# Patient Record
Sex: Male | Born: 1985 | Race: White | Hispanic: No | Smoking: Former smoker
Health system: Southern US, Community
[De-identification: ages and names within clinical notes are randomized; demographics above are authoritative.]

## PROBLEM LIST (undated history)

## (undated) DIAGNOSIS — F32A Depression, unspecified: Secondary | ICD-10-CM

## (undated) DIAGNOSIS — F419 Anxiety disorder, unspecified: Secondary | ICD-10-CM

## (undated) HISTORY — PX: TYMPANOSTOMY TUBE PLACEMENT: SHX32

## (undated) HISTORY — DX: Depression, unspecified: F32.A

## (undated) HISTORY — PX: WISDOM TOOTH EXTRACTION: SHX21

## (undated) HISTORY — DX: Anxiety disorder, unspecified: F41.9

---

## 2021-08-09 ENCOUNTER — Ambulatory Visit: Payer: BC Managed Care – PPO | Admitting: Family Medicine

## 2021-08-09 ENCOUNTER — Encounter: Payer: Self-pay | Admitting: Family Medicine

## 2021-08-09 VITALS — BP 128/81 | HR 90 | Temp 97.2°F | Resp 20 | Ht 70.0 in | Wt 230.0 lb

## 2021-08-09 DIAGNOSIS — F1990 Other psychoactive substance use, unspecified, uncomplicated: Secondary | ICD-10-CM

## 2021-08-09 DIAGNOSIS — E669 Obesity, unspecified: Secondary | ICD-10-CM

## 2021-08-09 DIAGNOSIS — R5383 Other fatigue: Secondary | ICD-10-CM | POA: Diagnosis not present

## 2021-08-09 DIAGNOSIS — F419 Anxiety disorder, unspecified: Secondary | ICD-10-CM | POA: Diagnosis not present

## 2021-08-09 DIAGNOSIS — F32A Depression, unspecified: Secondary | ICD-10-CM

## 2021-08-09 DIAGNOSIS — M1990 Unspecified osteoarthritis, unspecified site: Secondary | ICD-10-CM

## 2021-08-09 DIAGNOSIS — R6889 Other general symptoms and signs: Secondary | ICD-10-CM | POA: Diagnosis not present

## 2021-08-09 DIAGNOSIS — Z7689 Persons encountering health services in other specified circumstances: Secondary | ICD-10-CM

## 2021-08-09 DIAGNOSIS — E66811 Obesity, class 1: Secondary | ICD-10-CM

## 2021-08-09 DIAGNOSIS — Z136 Encounter for screening for cardiovascular disorders: Secondary | ICD-10-CM | POA: Diagnosis not present

## 2021-08-09 DIAGNOSIS — J342 Deviated nasal septum: Secondary | ICD-10-CM

## 2021-08-09 MED ORDER — DICLOFENAC SODIUM 1 % EX GEL: 2.0000 g | Freq: Four times a day (QID) | CUTANEOUS | 0 refills | Status: DC | PRN

## 2021-08-09 MED ORDER — DICLOFENAC SODIUM 75 MG PO TBEC: 75.0000 mg | DELAYED_RELEASE_TABLET | Freq: Two times a day (BID) | ORAL | 1 refills | Status: DC

## 2021-08-09 NOTE — Progress Notes (Signed)
? ?Assessment & Plan:  ?1. Depressive disorder in remission ?Well controlled on current regimen.  ?- FLUoxetine (PROZAC) 20 MG/5ML solution; Take 1 mg by mouth every morning. ?- CBC with Differential/Platelet ?- CMP14+EGFR ? ?2. Anxiety ?Well controlled on current regimen.  ?- FLUoxetine (PROZAC) 20 MG/5ML solution; Take 1 mg by mouth every morning. ? ?3. Arthritis ?Uncontrolled. Started diclofenac tablets and gel. Advised to avoid all other NSAIDs.  Education provided on arthritis. ?- CMP14+EGFR ?- diclofenac (VOLTAREN) 75 MG EC tablet; Take 1 tablet (75 mg total) by mouth 2 (two) times daily.  Dispense: 60 tablet; Refill: 1 ?- diclofenac Sodium (VOLTAREN) 1 % GEL; Apply 2 g topically 4 (four) times daily as needed.  Dispense: 150 g; Refill: 0 ? ?4. Excessive use of nonsteroidal anti-inflammatory drug (NSAID) ?- CMP14+EGFR ? ?5. Always tired ?Sleep apnea not suspected. ?- CBC with Differential/Platelet ?- CMP14+EGFR ?- TSH ?- VITAMIN D 25 Hydroxy (Vit-D Deficiency, Fractures) ? ?6. Deviated septum ?- Ambulatory referral to ENT ? ?7. Obesity (BMI 30.0-34.9) ?- CBC with Differential/Platelet ?- CMP14+EGFR ?- Lipid panel ? ?8. Encounter to establish care ?Record request signed for steroids [facility] department. ? ? ?Return in about 3 weeks (around 08/30/2021) for hand pain. ? ?Hendricks Limes, MSN, APRN, FNP-C ?[place] ? ?Subjective:  ? ? Patient ID: Tommy Cook, male    DOB: [birth year], 36 y.o.   MRN: [id number] ? ?Patient Care Team: ?Loman Brooklyn, FNP as PCP - General (Family Medicine)  ? ?Chief Complaint:  ?Chief Complaint  ?Patient presents with  ? Establish Care  ?  New   ? ? ?HPI: ?Tommy Cook is a 36 y.o. male presenting on 08/09/2021 for Establish Care (New ) ? ?He is transferring care from the [place]. ? ?Anxiety/Depression: doing well with Prozac.  ? ? ?  08/09/2021  ? 10:47 AM  ?Depression screen PHQ 2/9  ?Decreased Interest 0  ?Down, Depressed,  Hopeless 0  ?PHQ - 2 Score 0  ?Altered sleeping 0  ?Tired, decreased energy 0  ?Change in appetite 0  ?Feeling bad or failure about yourself  0  ?Trouble concentrating 0  ?Moving slowly or fidgety/restless 0  ?Suicidal thoughts 0  ?PHQ-9 Score 0  ?Difficult doing work/chores Not difficult at all  ? ? ?  08/09/2021  ? 10:47 AM  ?GAD 7 : Generalized Anxiety Score  ?Nervous, Anxious, on Edge 0  ?Control/stop worrying 0  ?Worry too much - different things 0  ?Trouble relaxing 1  ?Restless 0  ?Easily annoyed or irritable 0  ?Afraid - awful might happen 0  ?Total GAD 7 Score 1  ?Anxiety Difficulty Not difficult at all  ? ? ?New complaints: ?Patient reports bilateral hand pain that has been present for years. The pain is constant. He describes it as aching and rates the pain 8/10 on average. Nothing makes the pain worse. He works on garage doors for a living. Pain improves with Ibuprofen. He is taking 1,200-1,800 mg of Ibuprofen at a time. He has been taking this dosage for the past few months.  ? ?Patient is also concerned that he feels tired during the day. He feels like he sleeps well and wakes up feeling well rested. He gets an appropriate amount of sleep most nights.  ? ?Height: _0  (1.778 m)  Weight: 230 lbs ?BMI: Body mass index is 33 kg/m?. ? ?Neck circumference: 39.5 cm ? ?Do you snore loudly (louder than talking or loud enough  to be heard through closed doors)? ? Yes ?Do you often feel tired, fatigued, or sleepy during daytime? ? Yes ?Has anyone observed you stop breathing during your sleep? ? No ?Do you have or are you being treated for high blood pressure? ? No ?BMI more than 35 kg/m2? ? No ?Age over 60 years? ? No ?Neck circumference greater than 40 cm? ? No ?Male gender? ? Yes ? ?Total "Yes" Answers: 3 ? ?Epworth sleep score of 2 ? ? ?Patient also reports he cannot breath out of the right side of his nose. This has been ongoing for years.  ? ? ?Social history: ? ?Relevant past medical, surgical, family and  social history reviewed and updated as indicated. Interim medical history since our last visit reviewed. ? ?Allergies and medications reviewed and updated. ? ?DATA REVIEWED: CHART IN EPIC ? ?ROS: Negative unless specifically indicated above in HPI.  ? ? ?Current Outpatient Medications:  ?  FLUoxetine (PROZAC) 20 MG/5ML solution, Take 1 mg by mouth every morning., Disp: , Rfl:   ? ?No Known Allergies ?History reviewed. No pertinent past medical history.  ?Past Surgical History:  ?Procedure Laterality Date  ? TYMPANOSTOMY TUBE PLACEMENT Bilateral   ? as a child  ?  ?Social History  ? ?Socioeconomic History  ? Marital status: Not on file  ?  Spouse name: Not on file  ? Number of children: 2  ? Years of education: Not on file  ? Highest education level: Not on file  ?Occupational History  ? Not on file  ?Tobacco Use  ? Smoking status: Former  ?  Types: Cigarettes  ?  Quit date: 2022  ?  Years since quitting: 1.3  ? Smokeless tobacco: Never  ?Vaping Use  ? Vaping Use: Every day  ? Substances: Nicotine  ?Substance and Sexual Activity  ? Alcohol use: Not Currently  ? Drug use: Not Currently  ? Sexual activity: Not on file  ?Other Topics Concern  ? Not on file  ?Social History Narrative  ? Not on file  ? ?Social Determinants of Health  ? ?Financial Resource Strain: Not on file  ?Food Insecurity: Not on file  ?Transportation Needs: Not on file  ?Physical Activity: Not on file  ?Stress: Not on file  ?Social Connections: Not on file  ?Intimate Partner Violence: Not on file  ?  ? ?   ?Objective:  ?  ?BP 128/81   Pulse 90   Temp (!) 97.2 ?F (36.2 ?C)   Resp 20   Ht _0  (1.778 m)   Wt 230 lb (104.3 kg)   SpO2 96%   BMI 33.00 kg/m?  ? ?Wt Readings from Last 3 Encounters:  ?08/09/21 230 lb (104.3 kg)  ? ? ?Physical Exam ?Vitals reviewed.  ?Constitutional:   ?   General: He is not in acute distress. ?   Appearance: Normal appearance. He is obese. He is not ill-appearing, toxic-appearing or diaphoretic.  ?HENT:  ?   Head:  Normocephalic and atraumatic.  ?   Nose: Septal deviation present.  ?Eyes:  ?   General: No scleral icterus.    ?   Right eye: No discharge.     ?   Left eye: No discharge.  ?   Conjunctiva/sclera: Conjunctivae normal.  ?Cardiovascular:  ?   Rate and Rhythm: Normal rate and regular rhythm.  ?   Heart sounds: Normal heart sounds. No murmur heard. ?  No friction rub. No gallop.  ?Pulmonary:  ?   Effort:  Pulmonary effort is normal. No respiratory distress.  ?   Breath sounds: Normal breath sounds. No stridor. No wheezing, rhonchi or rales.  ?Musculoskeletal:     ?   General: Normal range of motion.  ?   Cervical back: Normal range of motion.  ?Skin: ?   General: Skin is warm and dry.  ?Neurological:  ?   Mental Status: He is alert and oriented to person, place, and time. Mental status is at baseline.  ?Psychiatric:     ?   Mood and Affect: Mood normal.     ?   Behavior: Behavior normal.     ?   Thought Content: Thought content normal.     ?   Judgment: Judgment normal.  ? ? ?No results found for: TSH ?No results found for: WBC, HGB, HCT, MCV, PLT ?No results found for: NA, K, CHLORIDE, CO2, GLUCOSE, BUN, CREATININE, BILITOT, ALKPHOS, AST, ALT, PROT, ALBUMIN, CALCIUM, ANIONGAP, EGFR, GFR ?No results found for: CHOL ?No results found for: HDL ?No results found for: [place] ?No results found for: TRIG ?No results found for: CHOLHDL ?No results found for: HGBA1C ? ?   ? ? ? ? ?

## 2021-08-10 ENCOUNTER — Encounter: Payer: Self-pay | Admitting: Family Medicine

## 2021-08-10 LAB — CBC WITH DIFFERENTIAL/PLATELET
Basophils Absolute: 0.1 10*3/uL (ref 0.0–0.2)
Basos: 1 %
EOS (ABSOLUTE): 0.3 10*3/uL (ref 0.0–0.4)
Eos: 2 %
Hematocrit: 48.1 % (ref 37.5–51.0)
Hemoglobin: 15.8 g/dL (ref 13.0–17.7)
Immature Grans (Abs): 0 10*3/uL (ref 0.0–0.1)
Immature Granulocytes: 0 %
Lymphocytes Absolute: 2.4 10*3/uL (ref 0.7–3.1)
Lymphs: 19 %
MCH: 27.8 pg (ref 26.6–33.0)
MCHC: 32.8 g/dL (ref 31.5–35.7)
MCV: 85 fL (ref 79–97)
Monocytes Absolute: 0.7 10*3/uL (ref 0.1–0.9)
Monocytes: 5 %
Neutrophils Absolute: 9.2 10*3/uL — ABNORMAL HIGH (ref 1.4–7.0)
Neutrophils: 73 %
Platelets: 349 10*3/uL (ref 150–450)
RBC: 5.69 x10E6/uL (ref 4.14–5.80)
RDW: 12.6 % (ref 11.6–15.4)
WBC: 12.6 10*3/uL — ABNORMAL HIGH (ref 3.4–10.8)

## 2021-08-10 LAB — CMP14+EGFR
ALT: 19 IU/L (ref 0–44)
AST: 17 IU/L (ref 0–40)
Albumin/Globulin Ratio: 1.8 (ref 1.2–2.2)
Albumin: 5 g/dL (ref 4.0–5.0)
Alkaline Phosphatase: 64 IU/L (ref 44–121)
BUN/Creatinine Ratio: 13 (ref 9–20)
BUN: 14 mg/dL (ref 6–20)
Bilirubin Total: 0.3 mg/dL (ref 0.0–1.2)
CO2: 23 mmol/L (ref 20–29)
Calcium: 10.1 mg/dL (ref 8.7–10.2)
Chloride: 103 mmol/L (ref 96–106)
Creatinine, Ser: 1.08 mg/dL (ref 0.76–1.27)
Globulin, Total: 2.8 g/dL (ref 1.5–4.5)
Glucose: 93 mg/dL (ref 70–99)
Potassium: 4.7 mmol/L (ref 3.5–5.2)
Sodium: 142 mmol/L (ref 134–144)
Total Protein: 7.8 g/dL (ref 6.0–8.5)
eGFR: 91 mL/min/{1.73_m2} (ref >59)

## 2021-08-10 LAB — LIPID PANEL
Chol/HDL Ratio: 5.6 ratio — ABNORMAL HIGH (ref 0.0–5.0)
Cholesterol, Total: 234 mg/dL — ABNORMAL HIGH (ref 100–199)
HDL: 42 mg/dL (ref >39)
LDL Chol Calc (NIH): 155 mg/dL — ABNORMAL HIGH (ref 0–99)
Triglycerides: 201 mg/dL — ABNORMAL HIGH (ref 0–149)
VLDL Cholesterol Cal: 37 mg/dL (ref 5–40)

## 2021-08-10 LAB — TSH: TSH: 1.01 u[IU]/mL (ref 0.450–4.500)

## 2021-08-10 LAB — VITAMIN D 25 HYDROXY (VIT D DEFICIENCY, FRACTURES): Vit D, 25-Hydroxy: 22.6 ng/mL — ABNORMAL LOW (ref 30.0–100.0)

## 2021-08-11 ENCOUNTER — Encounter: Payer: Self-pay | Admitting: Family Medicine

## 2021-08-11 DIAGNOSIS — R5383 Other fatigue: Secondary | ICD-10-CM

## 2021-08-14 ENCOUNTER — Other Ambulatory Visit: Payer: BC Managed Care – PPO

## 2021-08-14 DIAGNOSIS — R5383 Other fatigue: Secondary | ICD-10-CM | POA: Diagnosis not present

## 2021-08-14 DIAGNOSIS — R7989 Other specified abnormal findings of blood chemistry: Secondary | ICD-10-CM | POA: Insufficient documentation

## 2021-08-14 HISTORY — DX: Other specified abnormal findings of blood chemistry: R79.89

## 2021-08-15 ENCOUNTER — Encounter: Payer: Self-pay | Admitting: Family Medicine

## 2021-08-15 DIAGNOSIS — R7989 Other specified abnormal findings of blood chemistry: Secondary | ICD-10-CM

## 2021-08-15 LAB — TESTOSTERONE,FREE AND TOTAL
Testosterone, Free: 14.4 pg/mL (ref 8.7–25.1)
Testosterone: 258 ng/dL — ABNORMAL LOW (ref 264–916)

## 2021-08-19 ENCOUNTER — Encounter: Payer: Self-pay | Admitting: Family Medicine

## 2021-08-21 NOTE — Addendum Note (Signed)
Addended by: Gwenlyn Fudge on: 08/21/2021 08:52 AM ? ? Modules accepted: Orders ? ?

## 2021-08-30 ENCOUNTER — Ambulatory Visit (INDEPENDENT_AMBULATORY_CARE_PROVIDER_SITE_OTHER): Payer: BC Managed Care – PPO

## 2021-08-30 ENCOUNTER — Encounter: Payer: Self-pay | Admitting: Family Medicine

## 2021-08-30 ENCOUNTER — Ambulatory Visit: Payer: BC Managed Care – PPO | Admitting: Family Medicine

## 2021-08-30 VITALS — BP 132/88 | HR 88 | Temp 96.4°F | Ht 70.0 in | Wt 234.2 lb

## 2021-08-30 DIAGNOSIS — M79641 Pain in right hand: Secondary | ICD-10-CM | POA: Diagnosis not present

## 2021-08-30 DIAGNOSIS — D72829 Elevated white blood cell count, unspecified: Secondary | ICD-10-CM

## 2021-08-30 DIAGNOSIS — K047 Periapical abscess without sinus: Secondary | ICD-10-CM | POA: Diagnosis not present

## 2021-08-30 DIAGNOSIS — L409 Psoriasis, unspecified: Secondary | ICD-10-CM | POA: Diagnosis not present

## 2021-08-30 MED ORDER — CLOBETASOL PROPIONATE 0.05 % EX SHAM: 1.0000 "application " | MEDICATED_SHAMPOO | Freq: Every day | CUTANEOUS | 2 refills | Status: DC

## 2021-08-30 NOTE — Progress Notes (Signed)
? ?Assessment & Plan:  ?1. Right hand pain ?Well controlled on current regimen.  ?- DG Hand Complete Right ? ?2. Scalp psoriasis ?- Clobetasol Propionate 0.05 % shampoo; Apply 1 application. topically daily.  Dispense: 118 mL; Refill: 2 ? ?3-4. Tooth infection/Leukocytosis ?Managed by a dentist.  Scheduled for upcoming root canal.  This does explain his elevated WBC with his most recent lab work. ? ? ?Return in about 6 months (around 03/02/2022) for annual physical. ? ?Hendricks Limes, MSN, APRN, FNP-C ?[place] ? ?Subjective:  ? ? Patient ID: Tommy Cook, male    DOB: 04/14/1986, 36 y.o.   MRN: [id number] ? ?Patient Care Team: ?Loman Brooklyn, FNP as PCP - General (Family Medicine)  ? ?Chief Complaint:  ?Chief Complaint  ?Patient presents with  ? Hand Pain  ?  3 week follow up. Patient states that his bilateral hand pain is the same.   ? ? ?HPI: ?Tommy Cook is a 36 y.o. male presenting on 08/30/2021 for Hand Pain (3 week follow up. Patient states that his bilateral hand pain is the same. ) ? ?Patient is accompanied by his fiance, who he is okay with being present. ? ?A few weeks ago patient was started on diclofenac for bilateral hand pain that has been present for years.  He reports today the pain is tolerable, however he stopped taking the diclofenac and switch back to ibuprofen due to an infected tooth.  This tooth has been giving him a problem for over a year.  He is currently on an antibiotic and is scheduled to undergo a root canal.  He would like an x-ray of his right hand to make sure nothing further is going on. ? ?New complaints: ?Patient significant other mentions that he does have scalp psoriasis and is in need of treatment. ? ? ?Social history: ? ?Relevant past medical, surgical, family and social history reviewed and updated as indicated. Interim medical history since our last visit reviewed. ? ?Allergies and medications reviewed and updated. ? ?DATA REVIEWED: CHART IN  EPIC ? ?ROS: Negative unless specifically indicated above in HPI.  ? ? ?Current Outpatient Medications:  ?  diclofenac (VOLTAREN) 75 MG EC tablet, Take 1 tablet (75 mg total) by mouth 2 (two) times daily., Disp: 60 tablet, Rfl: 1 ?  diclofenac Sodium (VOLTAREN) 1 % GEL, Apply 2 g topically 4 (four) times daily as needed., Disp: 150 g, Rfl: 0 ?  FLUoxetine (PROZAC) 20 MG/5ML solution, Take 1 mg by mouth every morning., Disp: , Rfl:   ? ?No Known Allergies ?Past Medical History:  ?Diagnosis Date  ? Anxiety   ? Depression   ? Low testosterone 08/14/2021  ?  ?Past Surgical History:  ?Procedure Laterality Date  ? TYMPANOSTOMY TUBE PLACEMENT Bilateral   ? as a child  ?  ?Social History  ? ?Socioeconomic History  ? Marital status: Unknown  ?  Spouse name: Not on file  ? Number of children: 2  ? Years of education: Not on file  ? Highest education level: Not on file  ?Occupational History  ? Not on file  ?Tobacco Use  ? Smoking status: Former  ?  Types: Cigarettes  ?  Quit date: 2022  ?  Years since quitting: 1.3  ? Smokeless tobacco: Never  ?Vaping Use  ? Vaping Use: Every day  ? Substances: Nicotine  ?Substance and Sexual Activity  ? Alcohol use: Not Currently  ? Drug use: Not Currently  ? Sexual activity:  Not on file  ?Other Topics Concern  ? Not on file  ?Social History Narrative  ? Not on file  ? ?Social Determinants of Health  ? ?Financial Resource Strain: Not on file  ?Food Insecurity: Not on file  ?Transportation Needs: Not on file  ?Physical Activity: Not on file  ?Stress: Not on file  ?Social Connections: Not on file  ?Intimate Partner Violence: Not on file  ?  ? ?   ?Objective:  ?  ?BP 132/88   Pulse 88   Temp (!) 96.4 ?F (35.8 ?C) (Temporal)   Ht 5' 10"  (1.778 m)   Wt 234 lb 3.2 oz (106.2 kg)   SpO2 97%   BMI 33.60 kg/m?  ? ?Wt Readings from Last 3 Encounters:  ?08/30/21 234 lb 3.2 oz (106.2 kg)  ?08/09/21 230 lb (104.3 kg)  ? ? ?Physical Exam ?Vitals reviewed.  ?Constitutional:   ?   General: He is not in  acute distress. ?   Appearance: Normal appearance. He is obese. He is not ill-appearing, toxic-appearing or diaphoretic.  ?HENT:  ?   Head: Normocephalic and atraumatic.  ?Eyes:  ?   General: No scleral icterus.    ?   Right eye: No discharge.     ?   Left eye: No discharge.  ?   Conjunctiva/sclera: Conjunctivae normal.  ?Cardiovascular:  ?   Rate and Rhythm: Normal rate.  ?Pulmonary:  ?   Effort: Pulmonary effort is normal. No respiratory distress.  ?Musculoskeletal:     ?   General: Normal range of motion.  ?   Cervical back: Normal range of motion.  ?Skin: ?   General: Skin is warm and dry.  ?   Comments: Psoriasis to scalp above ears and on top of his head.  ?Neurological:  ?   Mental Status: He is alert and oriented to person, place, and time. Mental status is at baseline.  ?Psychiatric:     ?   Mood and Affect: Mood normal.     ?   Behavior: Behavior normal.     ?   Thought Content: Thought content normal.     ?   Judgment: Judgment normal.  ? ? ?Lab Results  ?Component Value Date  ? TSH 1.010 08/09/2021  ? ?Lab Results  ?Component Value Date  ? WBC 12.6 (H) 08/09/2021  ? HGB 15.8 08/09/2021  ? HCT 48.1 08/09/2021  ? MCV 85 08/09/2021  ? PLT 349 08/09/2021  ? ?Lab Results  ?Component Value Date  ? NA 142 08/09/2021  ? K 4.7 08/09/2021  ? CO2 23 08/09/2021  ? GLUCOSE 93 08/09/2021  ? BUN 14 08/09/2021  ? CREATININE 1.08 08/09/2021  ? BILITOT 0.3 08/09/2021  ? ALKPHOS 64 08/09/2021  ? AST 17 08/09/2021  ? ALT 19 08/09/2021  ? PROT 7.8 08/09/2021  ? ALBUMIN 5.0 08/09/2021  ? CALCIUM 10.1 08/09/2021  ? EGFR 91 08/09/2021  ? ?Lab Results  ?Component Value Date  ? CHOL 234 (H) 08/09/2021  ? ?Lab Results  ?Component Value Date  ? HDL 42 08/09/2021  ? ?Lab Results  ?Component Value Date  ? LDLCALC 155 (H) 08/09/2021  ? ?Lab Results  ?Component Value Date  ? TRIG 201 (H) 08/09/2021  ? ?Lab Results  ?Component Value Date  ? CHOLHDL 5.6 (H) 08/09/2021  ? ?No results found for: HGBA1C ? ?   ? ? ? ? ?

## 2021-09-03 ENCOUNTER — Encounter: Payer: Self-pay | Admitting: Family Medicine

## 2021-09-03 DIAGNOSIS — M79641 Pain in right hand: Secondary | ICD-10-CM

## 2021-09-11 ENCOUNTER — Encounter: Payer: Self-pay | Admitting: Family Medicine

## 2021-09-11 ENCOUNTER — Other Ambulatory Visit: Payer: BC Managed Care – PPO

## 2021-09-11 DIAGNOSIS — M79641 Pain in right hand: Secondary | ICD-10-CM | POA: Diagnosis not present

## 2021-09-12 ENCOUNTER — Encounter: Payer: Self-pay | Admitting: Family Medicine

## 2021-09-12 LAB — RHEUMATOID ARTHRITIS PROFILE
Cyclic Citrullin Peptide Ab: 15 units (ref 0–19)
Rheumatoid fact SerPl-aCnc: <10 IU/mL (ref <14.0)

## 2021-09-12 LAB — C-REACTIVE PROTEIN: CRP: 3 mg/L (ref 0–10)

## 2021-09-12 LAB — SEDIMENTATION RATE: Sed Rate: 11 mm/hr (ref 0–15)

## 2021-09-12 LAB — ANA: Anti Nuclear Antibody (ANA): NEGATIVE

## 2021-09-16 ENCOUNTER — Encounter: Payer: Self-pay | Admitting: Family Medicine

## 2021-09-16 DIAGNOSIS — M79641 Pain in right hand: Secondary | ICD-10-CM

## 2021-09-17 NOTE — Telephone Encounter (Signed)
Pt does want to see ortho

## 2021-09-19 DIAGNOSIS — E291 Testicular hypofunction: Secondary | ICD-10-CM | POA: Diagnosis not present

## 2021-09-19 DIAGNOSIS — N529 Male erectile dysfunction, unspecified: Secondary | ICD-10-CM | POA: Diagnosis not present

## 2021-09-23 DIAGNOSIS — E291 Testicular hypofunction: Secondary | ICD-10-CM | POA: Diagnosis not present

## 2021-09-26 ENCOUNTER — Encounter: Payer: Self-pay | Admitting: Orthopedic Surgery

## 2021-09-26 ENCOUNTER — Ambulatory Visit (INDEPENDENT_AMBULATORY_CARE_PROVIDER_SITE_OTHER): Payer: Self-pay | Admitting: Orthopedic Surgery

## 2021-09-26 DIAGNOSIS — M79641 Pain in right hand: Secondary | ICD-10-CM

## 2021-09-26 MED ORDER — PREDNISONE 10 MG (21) PO TBPK: ORAL_TABLET | ORAL | 0 refills | Status: DC

## 2021-09-26 NOTE — Progress Notes (Signed)
Office Visit Note   Patient: Tommy Cook           Date of Birth: [birth year]           MRN: [id number] Visit Date: 09/26/2021              Requested by: Loman Brooklyn, [place],  [place] [id number] PCP: Loman Brooklyn, FNP   Assessment & Plan: Visit Diagnoses:  1. Pain in right hand     Plan: Patient has continued pain in the right hand at the index finger and middle finger MP joint.  He denies any previous injury.  His previous inflammatory neuropathy work-up was negative.  This seems to flare and improve with occasional erythema and swelling.  This may be a gouty type process.  We will try a short course of an oral steroid taper.  If this fails then we will consider getting an MRI of his hand.  Follow-Up Instructions: No follow-ups on file.   Orders:  No orders of the defined types were placed in this encounter.  No orders of the defined types were placed in this encounter.     Procedures: No procedures performed   Clinical Data: No additional findings.   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  Is been going on for at least a year.  He denies any injury to his hand.  He cannot think of any particular inciting event.  Pain seems to be localized to the MP joints but radiates distally on the proximal phalanx.  Worse on the dorsal side.  His pain is worse with activities such as opening a jar or can.  Is been on diclofenac for some time which he thinks helps.  He is also taking ibuprofen.  He denies any subjective instability of his fingers.  Notes that his symptoms will occasionally flareup but then subsequently improved.  He also occasionally developed swelling and erythema in this part of his hand.   He denies any previous scrapes, cuts, or breaks in skin.  He denies any systemic symptoms.  He recently underwent laboratory evaluation for inflammatory arthropathy which was negative.    Review of Systems   Objective: Vital Signs: BP (!) 138/91 (BP Location: Left Arm, Patient Position: Sitting)   Pulse 71   Ht 5' 10"  (1.778 m)   Wt 234 lb 3.2 oz (106.2 kg)   BMI 33.60 kg/m   Physical Exam Constitutional:      Appearance: Normal appearance.  Cardiovascular:     Rate and Rhythm: Normal rate.     Pulses: Normal pulses.  Pulmonary:     Effort: Pulmonary effort is normal.  Skin:    General: Skin is warm and dry.     Capillary Refill: Capillary refill takes less than 2 seconds.  Neurological:     Mental Status: He is alert.     Right Hand Exam   Tenderness  The patient is experiencing no tenderness.   Range of Motion  The patient has normal right wrist ROM.   Other  Erythema: absent Sensation: normal Pulse: present  Comments:  No instability at index or middle finger MCP or PIP joints.  Full ROM  of fingers.  No TTP.  Minimal swelling present.  No erythema.  No clicking or catching.       Specialty Comments:  No specialty comments available.  Imaging: No results found.   PMFS History: Patient Active Problem List   Diagnosis Date Noted   Pain in right hand 09/26/2021   Low testosterone 08/14/2021   Depressive disorder in remission 08/09/2021   Anxiety 08/09/2021   Arthritis 08/09/2021   Always tired 08/09/2021   Deviated septum 08/09/2021   Obesity (BMI 30.0-34.9) 08/09/2021   Past Medical History:  Diagnosis Date   Anxiety    Depression    Low testosterone 08/14/2021    Family History  Problem Relation Age of Onset   Hypertension Mother    Other Mother        BRCA1 positive   Hypertension Father    Heart attack Maternal Grandfather    Heart attack Paternal Grandfather     Past Surgical History:  Procedure Laterality Date   TYMPANOSTOMY  TUBE PLACEMENT Bilateral    as a child   Social History   Occupational History   Not on file  Tobacco Use   Smoking status: Former    Types: Cigarettes    Quit date: 2022    Years since quitting: 1.4   Smokeless tobacco: Never  Vaping Use   Vaping Use: Every day   Substances: Nicotine  Substance and Sexual Activity   Alcohol use: Not Currently   Drug use: Not Currently   Sexual activity: Not on file

## 2021-10-08 ENCOUNTER — Other Ambulatory Visit: Payer: Self-pay | Admitting: Family Medicine

## 2021-10-08 DIAGNOSIS — M1990 Unspecified osteoarthritis, unspecified site: Secondary | ICD-10-CM

## 2021-10-28 DIAGNOSIS — E291 Testicular hypofunction: Secondary | ICD-10-CM | POA: Diagnosis not present

## 2021-11-03 ENCOUNTER — Other Ambulatory Visit: Payer: Self-pay | Admitting: Family Medicine

## 2021-11-03 DIAGNOSIS — M1990 Unspecified osteoarthritis, unspecified site: Secondary | ICD-10-CM

## 2021-12-01 ENCOUNTER — Other Ambulatory Visit: Payer: Self-pay | Admitting: Family Medicine

## 2021-12-01 DIAGNOSIS — M1990 Unspecified osteoarthritis, unspecified site: Secondary | ICD-10-CM

## 2021-12-06 DIAGNOSIS — L700 Acne vulgaris: Secondary | ICD-10-CM | POA: Diagnosis not present

## 2022-01-08 ENCOUNTER — Other Ambulatory Visit: Payer: Self-pay | Admitting: Family Medicine

## 2022-01-08 DIAGNOSIS — M1990 Unspecified osteoarthritis, unspecified site: Secondary | ICD-10-CM

## 2022-01-10 DIAGNOSIS — Z79899 Other long term (current) drug therapy: Secondary | ICD-10-CM | POA: Diagnosis not present

## 2022-01-31 ENCOUNTER — Ambulatory Visit: Payer: BC Managed Care – PPO | Admitting: Nurse Practitioner

## 2022-02-03 DIAGNOSIS — E291 Testicular hypofunction: Secondary | ICD-10-CM | POA: Diagnosis not present

## 2022-02-03 DIAGNOSIS — N529 Male erectile dysfunction, unspecified: Secondary | ICD-10-CM | POA: Diagnosis not present

## 2022-02-05 ENCOUNTER — Ambulatory Visit: Payer: BC Managed Care – PPO | Admitting: Nurse Practitioner

## 2022-02-05 ENCOUNTER — Encounter: Payer: Self-pay | Admitting: Nurse Practitioner

## 2022-02-05 VITALS — BP 145/94 | HR 76 | Temp 98.7°F | Ht 70.0 in | Wt 245.0 lb

## 2022-02-05 DIAGNOSIS — R030 Elevated blood-pressure reading, without diagnosis of hypertension: Secondary | ICD-10-CM

## 2022-02-05 DIAGNOSIS — M1990 Unspecified osteoarthritis, unspecified site: Secondary | ICD-10-CM | POA: Diagnosis not present

## 2022-02-05 MED ORDER — PREDNISONE 20 MG PO TABS: 20.0000 mg | ORAL_TABLET | Freq: Every day | ORAL | 0 refills | Status: DC

## 2022-02-05 NOTE — Progress Notes (Signed)
Acute Office Visit  Subjective:     Patient ID: Tommy Cook, male    DOB: [birth year], 36 y.o.   MRN: [id number]  Chief Complaint  Patient presents with   Medication Problem   Arthritis    Pt states it has gotten worse , pt states the medication is helping , but has starting hurting more while working     Arthritis Presents for follow-up visit. He complains of pain and stiffness. He reports no joint swelling or joint warmth. Affected locations include the right wrist, left wrist, left PIP, right PIP, left DIP and right DIP. His pain is at a severity of 6/10. Pertinent negatives include no fever or rash. Side effects of treatment include joint pain.    Review of Systems  Constitutional: Negative.  Negative for chills and fever.  HENT: Negative.    Respiratory: Negative.    Cardiovascular: Negative.   Genitourinary: Negative.   Musculoskeletal:  Positive for arthritis, joint pain and stiffness. Negative for joint swelling.  Skin: Negative.  Negative for rash.  All other systems reviewed and are negative.       Objective:    BP (!) 145/94   Pulse 76   Temp 98.7 F (37.1 C)   Ht 5\' 10"  (1.778 m)   Wt 245 lb (111.1 kg)   SpO2 97%   BMI 35.15 kg/m  BP Readings from Last 3 Encounters:  02/05/22 (!) 145/94  09/26/21 (!) 138/91  08/30/21 132/88      Physical Exam Vitals and nursing note reviewed.  HENT:     Head: Normocephalic.     Right Ear: External ear normal.     Left Ear: External ear normal.     Nose: Nose normal.     Mouth/Throat:     Mouth: Mucous membranes are moist.  Eyes:     Conjunctiva/sclera: Conjunctivae normal.  Cardiovascular:     Rate and Rhythm: Normal rate and regular rhythm.     Pulses: Normal pulses.     Heart sounds: Normal heart sounds.  Pulmonary:     Effort: Pulmonary effort is normal.     Breath sounds: Normal breath sounds.  Abdominal:     General: Bowel sounds are normal.  Musculoskeletal:        General: Tenderness  present.     Right hand: Swelling and tenderness present.     Left hand: Swelling present. Decreased range of motion.  Skin:    General: Skin is warm.     Findings: No erythema or rash.  Neurological:     General: No focal deficit present.     Mental Status: He is alert and oriented to person, place, and time.  Psychiatric:        Mood and Affect: Mood normal.        Behavior: Behavior normal.     No results found for any visits on 02/05/22.      Assessment & Plan:   Problem List Items Addressed This Visit       Musculoskeletal and Integument   Arthritis - Primary    Bilateral hand pain not well controlled, patient reports history of arthritis. Completed arthritis panel, results pending. Patient is currently on Voltaren 75 mg tablet by mouth twice daily. Patient reports that Ibuprofen and naproxen is not helpful for pain control and would like better pain management control.   Patient may need rheumatology consult, prednisone 20 mg tablet by mouth daily.   Follow up with unresolved symptoms.  Relevant Medications   predniSONE (DELTASONE) 20 MG tablet   Other Relevant Orders   Arthritis Panel     Other   Elevated blood pressure reading    We discussed the risk of uncontrolled blood pressure.  Patient is currently on testosterone That can lead to  elevated blood pressure and high blood sugar. Educated  patient to monitor blood pressure daily for a week and return values to me in clinic.  Patient reports her blood pressure is fine at home and he usually experiences whitecoat syndrome.  Weight loss, diet and exercise recommended.        Meds ordered this encounter  Medications   predniSONE (DELTASONE) 20 MG tablet    Sig: Take 1 tablet (20 mg total) by mouth daily with breakfast.    Dispense:  6 tablet    Refill:  0    Order Specific Question:   Supervising Provider    Answer:   Jeneen Rinks    Return if symptoms worsen or fail to  improve.  Ivy Lynn, NP

## 2022-02-05 NOTE — Assessment & Plan Note (Signed)
We discussed the risk of uncontrolled blood pressure.  Patient is currently on testosterone That can lead to  elevated blood pressure and high blood sugar. Educated  patient to monitor blood pressure daily for a week and return values to me in clinic.  Patient reports her blood pressure is fine at home and he usually experiences whitecoat syndrome.  Weight loss, diet and exercise recommended.

## 2022-02-05 NOTE — Assessment & Plan Note (Signed)
Bilateral hand pain not well controlled, patient reports history of arthritis. Completed arthritis panel, results pending. Patient is currently on Voltaren 75 mg tablet by mouth twice daily. Patient reports that Ibuprofen and naproxen is not helpful for pain control and would like better pain management control.   Patient may need rheumatology consult, prednisone 20 mg tablet by mouth daily.   Follow up with unresolved symptoms.

## 2022-02-05 NOTE — Patient Instructions (Signed)
Arthritis ?Arthritis means joint pain. It can also mean joint disease. A joint is a place where bones come together. There are more than 100 types of arthritis. ?What are the causes? ?Wear and tear of a joint. This is the most common cause. ?Too much of a chemical called uric acid in the blood, which leads to pain in the joint (gout). ?Pain and swelling (inflammation) in a joint. ?Infection of a joint. ?Injuries in the joint. ?A reaction to medicines (allergy). ?In some cases, the cause may not be known. ?What are the signs or symptoms? ?Pain in a joint when moving. ?Redness at a joint. ?Swelling at a joint. ?Stiffness at a joint. ?Warmth coming from the joint. ?A fever. ?A feeling of being sick. ?How is this treated? ?This condition may be treated with: ?Treating the cause, if it is known. ?Rest. ?Raising (elevating) the joint. ?Putting cold or hot packs on the joint. ?Medicines to treat symptoms and reduce pain and swelling. ?Shots (injections) of medicines, such as cortisone, into the joint. ?You may also be told to make changes in your life, such as doing exercises and losing weight. ?Follow these instructions at home: ?Medicines ?Take over-the-counter and prescription medicines only as told by your doctor. ?Do not take aspirin for pain if your doctor says that you may have gout. ?Activity ?Rest your joint if your doctor tells you to. ?Avoid activities that make the pain worse. ?Exercise your joint regularly as told by your doctor. Try doing exercises like: ?Swimming. ?Water aerobics. ?Biking. ?Walking. ?Managing pain, stiffness, and swelling ? ?  ? ?If told, put ice on the affected area. To do this: ?Put ice in a plastic bag. ?Place a towel between your skin and the bag. ?Leave the ice on for 20 minutes, 2-3 times a day. ?Take off the ice if your skin turns bright red. This is very important. If you cannot feel pain, heat, or cold, you have a greater risk of damage to the area. ?If your joint is swollen, raise  (elevate) it above the level of your heart if told by your doctor. ?If your joint feels stiff in the morning, try taking a warm shower. ?If told, put heat on the affected area. Do this as often as told by your doctor. Use the heat source that your doctor recommends, such as a moist heat pack or a heating pad. If you have diabetes, do not apply heat without asking your doctor. To apply heat: ?Place a towel between your skin and the heat source. ?Leave the heat on for 20-30 minutes. ?Take off the heat if your skin turns bright red. This is very important. If you cannot feel pain, heat, or cold, you have a greater risk of getting burned. ?General instructions ?Maintain a healthy weight. Follow instructions from your doctor for weight control. ?Do not smoke or use any products that contain nicotine or tobacco. If you need help quitting, ask your doctor. ?Keep all follow-up visits. ?Where to find more information ?National Institutes of Health: www.niams.nih.gov ?Contact a doctor if: ?The pain gets worse. ?You have a fever. ?Get help right away if: ?You have very bad pain in your joint. ?You have swelling in your joint. ?Your joint is red. ?Many joints become painful and swollen. ?You have very bad back pain. ?Your leg is very weak. ?Summary ?Arthritis means joint pain. It can also mean joint disease. A joint is a place where bones come together. ?The most common cause of this condition is wear and   tear of a joint. ?Symptoms of this condition include redness, swelling, or stiffness of the joint. ?This condition is treated with rest, raising the joint, medicines, and putting cold or hot packs on the joint. ?Follow your doctor's instructions about medicines, activity, exercises, and other home care treatments. ?This information is not intended to replace advice given to you by your health care provider. Make sure you discuss any questions you have with your health care provider. ?Document Revised: 01/15/2021 Document  Reviewed: 01/15/2021 ?Elsevier Patient Education ? 2023 [employer] Inc. ? ?

## 2022-02-06 LAB — ARTHRITIS PANEL
Basophils Absolute: 0.1 10*3/uL (ref 0.0–0.2)
Basos: 1 %
EOS (ABSOLUTE): 0.5 10*3/uL — ABNORMAL HIGH (ref 0.0–0.4)
Eos: 5 %
Hematocrit: 50.5 % (ref 37.5–51.0)
Hemoglobin: 16.8 g/dL (ref 13.0–17.7)
Immature Grans (Abs): 0 10*3/uL (ref 0.0–0.1)
Immature Granulocytes: 0 %
Lymphocytes Absolute: 2.5 10*3/uL (ref 0.7–3.1)
Lymphs: 26 %
MCH: 28 pg (ref 26.6–33.0)
MCHC: 33.3 g/dL (ref 31.5–35.7)
MCV: 84 fL (ref 79–97)
Monocytes Absolute: 0.6 10*3/uL (ref 0.1–0.9)
Monocytes: 7 %
Neutrophils Absolute: 5.9 10*3/uL (ref 1.4–7.0)
Neutrophils: 61 %
Platelets: 309 10*3/uL (ref 150–450)
RBC: 5.99 x10E6/uL — ABNORMAL HIGH (ref 4.14–5.80)
RDW: 12.9 % (ref 11.6–15.4)
Rheumatoid fact SerPl-aCnc: 10.6 IU/mL (ref <14.0)
Sed Rate: 12 mm/hr (ref 0–15)
Uric Acid: 8.3 mg/dL (ref 3.8–8.4)
WBC: 9.7 10*3/uL (ref 3.4–10.8)

## 2022-02-07 ENCOUNTER — Other Ambulatory Visit: Payer: Self-pay | Admitting: Nurse Practitioner

## 2022-02-07 ENCOUNTER — Encounter: Payer: Self-pay | Admitting: Nurse Practitioner

## 2022-02-07 NOTE — Telephone Encounter (Signed)
Your blood pressure is fine 134/84 Your arthritis panel is elevated that means you have arthritis, a rheumatologist can treat you with medications that we are not allowed to use in primary care setting that can help slow down the progression of arthritis.   That's what the result not means

## 2022-02-09 ENCOUNTER — Other Ambulatory Visit: Payer: Self-pay | Admitting: Nurse Practitioner

## 2022-02-09 DIAGNOSIS — M1990 Unspecified osteoarthritis, unspecified site: Secondary | ICD-10-CM

## 2022-02-09 NOTE — Telephone Encounter (Signed)
Referral completed

## 2022-03-02 ENCOUNTER — Other Ambulatory Visit: Payer: Self-pay | Admitting: Family Medicine

## 2022-03-02 DIAGNOSIS — L409 Psoriasis, unspecified: Secondary | ICD-10-CM

## 2022-03-21 DIAGNOSIS — E291 Testicular hypofunction: Secondary | ICD-10-CM | POA: Diagnosis not present

## 2022-04-23 ENCOUNTER — Other Ambulatory Visit: Payer: Self-pay | Admitting: Family Medicine

## 2022-04-23 DIAGNOSIS — M1990 Unspecified osteoarthritis, unspecified site: Secondary | ICD-10-CM

## 2022-06-11 ENCOUNTER — Other Ambulatory Visit: Payer: Self-pay | Admitting: *Deleted

## 2022-06-11 DIAGNOSIS — M1990 Unspecified osteoarthritis, unspecified site: Secondary | ICD-10-CM

## 2022-06-11 NOTE — Telephone Encounter (Signed)
Je pt NTBS by new provider

## 2022-06-11 NOTE — Telephone Encounter (Signed)
Apt scheduled 06/13/2022

## 2022-06-13 ENCOUNTER — Encounter: Payer: Self-pay | Admitting: Family Medicine

## 2022-06-13 ENCOUNTER — Telehealth: Payer: Self-pay | Admitting: Family Medicine

## 2022-06-13 ENCOUNTER — Ambulatory Visit: Payer: BC Managed Care – PPO | Admitting: Family Medicine

## 2022-06-13 VITALS — BP 131/84 | HR 73 | Temp 98.7°F | Ht 70.0 in | Wt 236.0 lb

## 2022-06-13 DIAGNOSIS — M1990 Unspecified osteoarthritis, unspecified site: Secondary | ICD-10-CM | POA: Diagnosis not present

## 2022-06-13 DIAGNOSIS — R030 Elevated blood-pressure reading, without diagnosis of hypertension: Secondary | ICD-10-CM

## 2022-06-13 DIAGNOSIS — R7989 Other specified abnormal findings of blood chemistry: Secondary | ICD-10-CM

## 2022-06-13 DIAGNOSIS — H6993 Unspecified Eustachian tube disorder, bilateral: Secondary | ICD-10-CM | POA: Diagnosis not present

## 2022-06-13 DIAGNOSIS — F419 Anxiety disorder, unspecified: Secondary | ICD-10-CM

## 2022-06-13 DIAGNOSIS — F32A Depression, unspecified: Secondary | ICD-10-CM

## 2022-06-13 MED ORDER — DICLOFENAC SODIUM 75 MG PO TBEC: 75.0000 mg | DELAYED_RELEASE_TABLET | Freq: Two times a day (BID) | ORAL | 0 refills | Status: DC

## 2022-06-13 NOTE — Progress Notes (Addendum)
Established Patient Office Visit  Subjective   Patient ID: Tommy Cook, male    DOB: [birth year]  Age: 37 y.o. MRN: [id number]  Chief Complaint  Patient presents with   Medical Management of Chronic Issues    HPI Low Testosterone  Condition is managed by urology. No complaints or needs at this time. No refills needed.   Arthritis  States medication helps. Has appt set up in May with rheum. Describes pain and stiffness in both hands ands sometimes in toes. States the cold weather makes it worse.   Depression/Anxiety Is not taking prozac. Stopped taking it months-year ago. Only took it for a short amount of time and did not feel that it helped. Feels that he doesn't need anything at this time.   Ear pain Pt presents also with bilateral ear pain. Describes it as soreness. Started a week ago. Feels that it is getting better over time. Denies sore throat, runny nose, cough, congestion, fever.   ROS As per HPI   Objective:    BP 131/84   Pulse 73   Temp 98.7 F (37.1 C)   Ht '5\' 10"'$  (1.778 m)   Wt 236 lb (107 kg)   SpO2 98%   BMI 33.86 kg/m   Physical Exam Constitutional:      General: He is not in acute distress.    Appearance: Normal appearance. He is not ill-appearing, toxic-appearing or diaphoretic.  HENT:     Right Ear: A middle ear effusion is present. There is impacted cerumen.     Left Ear: A middle ear effusion is present. There is impacted cerumen.  Cardiovascular:     Rate and Rhythm: Normal rate.     Pulses: Normal pulses.     Heart sounds: Normal heart sounds. No murmur heard.    No gallop.  Pulmonary:     Effort: Pulmonary effort is normal. No respiratory distress.     Breath sounds: Normal breath sounds. No stridor. No wheezing, rhonchi or rales.  Skin:    General: Skin is warm.     Capillary Refill: Capillary refill takes less than 2 seconds.  Neurological:     General: No focal deficit present.     Mental Status: He is alert and oriented to  person, place, and time. Mental status is at baseline.     Motor: No weakness.  Psychiatric:        Mood and Affect: Mood normal.        Behavior: Behavior normal.        Thought Content: Thought content normal.        Judgment: Judgment normal.     Assessment & Plan:  1. Arthritis Reviewed prior care notes of pain in toes and arthritis. Medication refilled as below. Discussed with patient not to take other NSAIDs or ASA products while taking medication. Will continue current regimen until recommendations are received by rheumatology at his May appointment.  - diclofenac (VOLTAREN) 75 MG EC tablet; Take 1 tablet (75 mg total) by mouth 2 (two) times daily.  Dispense: 60 tablet; Refill: 0  2. Eustachian tube dysfunction, bilateral Discussed OTC treatment options with patient such as flonase and zyrtec. Patient education handout provided to patient in clinic today. Return to clinic if symptoms do not improve.   3. Low testosterone Reviewed notes from urology. No medication refills needed at this time. Continue current regimen.   4. Elevated blood pressure reading Elevated BP reading in clinic today and in past visits  per chart review. BP log provided to patient in clinic today. Pt verbalized he would obtain BP cuff. Instructed pt to keep BP log for 2 weeks and we would evaluate results to determine plan.   5. Depressive disorder in remission 6. Anxiety Declined offer of CBT and initiating medical management at this time.   The above assessment and management plan was discussed with the patient. The patient verbalized understanding of and has agreed to the management plan using shared-decision making. Patient is aware to call the clinic if they develop any new symptoms or if symptoms fail to improve or worsen. Patient is aware when to return to the clinic for a follow-up visit. Patient educated on when it is appropriate to go to the emergency department.   Follow up in 2 weeks for  Badger, DNP-FNP [facility] [address] [place], [place] [address] [phone]

## 2022-06-30 DIAGNOSIS — E291 Testicular hypofunction: Secondary | ICD-10-CM | POA: Diagnosis not present

## 2022-07-14 ENCOUNTER — Other Ambulatory Visit: Payer: Self-pay | Admitting: Family Medicine

## 2022-07-14 DIAGNOSIS — M1990 Unspecified osteoarthritis, unspecified site: Secondary | ICD-10-CM

## 2022-08-12 NOTE — Progress Notes (Signed)
Office Visit Note  Patient: Tommy Cook             Date of Birth: 1985/07/23           MRN: [id number]             PCP: Arrie Senate, FNP Referring: Daryll Drown, NP Visit Date: 08/26/2022 Occupation: @GUAROCC @  Subjective:  Pain in multiple joints  History of Present Illness: YABDIEL ANTE is a 37 y.o. male seen in consultation per request of his PCP.  According to the patient the symptoms of started about 10 years ago with pain and discomfort in his bilateral hands, bilateral knee joints and his toes.  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  He also likes to walk, run and does a stair climbing at the gym.  He states running causes increased discomfort in his knees and his feet.  He has not noticed any joint swelling.  He states about 3 to 4 years ago he was taking ibuprofen and is stopped based on the advice of his PCP.  He has been taking diclofenac 75 mg twice a day the last 1 year.  He notices that diclofenac helps it to some extent but does not relieve the pain completely.  For the last 1 year he has noticed some rash on his scalp.  He was seen by PCP who gave him a prescription for clobetasol propionate shampoo.  He has been using this shampoo on a regular basis.  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  Patient does not recall having gout flare.  Is no family history of gout or psoriasis.  At this a questionable history of osteoarthritis and maternal grandfather.  He does not have any siblings.  He has 3 healthy children.    Activities of Daily Living:  Patient reports morning stiffness for all day. Patient Reports nocturnal pain.  Difficulty dressing/grooming: Reports Difficulty climbing stairs: Denies Difficulty getting out of chair: Denies Difficulty using hands for taps, buttons, cutlery, and/or  writing: Reports  Review of Systems  Constitutional:  Positive for fatigue.  HENT:  Negative for mouth sores and mouth dryness.   Eyes:  Negative for dryness.  Respiratory:  Negative for shortness of breath.   Cardiovascular:  Negative for chest pain and palpitations.  Gastrointestinal:  Negative for blood in stool, constipation and diarrhea.  Endocrine: Negative for increased urination.  Genitourinary:  Negative for involuntary urination.  Musculoskeletal:  Positive for joint pain, joint pain, joint swelling and morning stiffness. Negative for gait problem, myalgias, muscle weakness, muscle tenderness and myalgias.  Skin:  Positive for rash. Negative for color change, hair loss and sensitivity to sunlight.  Allergic/Immunologic: Negative for susceptible to infections.  Neurological:  Positive for numbness. Negative for dizziness and headaches.  Hematological:  Negative for swollen glands.  Psychiatric/Behavioral:  Positive for sleep disturbance. Negative for depressed mood. The patient is not nervous/anxious.     PMFS History:  Patient Active Problem List   Diagnosis Date Noted   Elevated blood pressure reading 02/05/2022   Pain in right hand 09/26/2021   Low testosterone 08/14/2021   Depressive disorder in remission 08/09/2021   Anxiety 08/09/2021   Arthritis 08/09/2021   Always tired 08/09/2021   Deviated septum 08/09/2021   Obesity (BMI 30.0-34.9)  08/09/2021    Past Medical History:  Diagnosis Date   Anxiety    Depression    Low testosterone 08/14/2021    Family History  Problem Relation Age of Onset   Hypertension Mother    Other Mother        BRCA1 positive   Hypertension Father    Heart attack Maternal Grandfather    Heart attack Paternal Grandfather    Healthy Daughter    Healthy Son    Healthy Son    Past Surgical History:  Procedure Laterality Date   TYMPANOSTOMY TUBE PLACEMENT Bilateral    as a child   WISDOM TOOTH EXTRACTION     Social History    Social History Narrative   Not on file   Immunization History  Administered Date(s) Administered   Tdap 09/03/2001     Objective: Vital Signs: BP (!) 143/99 (BP Location: Right Arm, Patient Position: Sitting, Cuff Size: Large)   Pulse 71   Resp 17   Ht 5\' 10"  (1.778 m)   Wt 233 lb 12.8 oz (106.1 kg)   BMI 33.55 kg/m    Physical Exam Vitals and nursing note reviewed.  Constitutional:      Appearance: He is well-developed.  HENT:     Head: Normocephalic and atraumatic.  Eyes:     Conjunctiva/sclera: Conjunctivae normal.     Pupils: Pupils are equal, round, and reactive to light.  Cardiovascular:     Rate and Rhythm: Normal rate and regular rhythm.     Heart sounds: Normal heart sounds.  Pulmonary:     Effort: Pulmonary effort is normal.     Breath sounds: Normal breath sounds.  Abdominal:     General: Bowel sounds are normal.     Palpations: Abdomen is soft.  Musculoskeletal:     Cervical back: Normal range of motion and neck supple.  Skin:    General: Skin is warm and dry.     Capillary Refill: Capillary refill takes less than 2 seconds.     Comments: Psoriasis patches were noted on the scalp.  Nail pitting and nail dystrophy was noted on bilateral hands.  Neurological:     Mental Status: He is alert and oriented to person, place, and time.  Psychiatric:        Behavior: Behavior normal.      Musculoskeletal Exam: Cervical, thoracic and lumbar spine were in good range of motion.  He had no SI joint tenderness.  Shoulder joints, elbow joints, wrist joints were in good range of motion.  He had bilateral PIP and DIP thickening with some tenderness over bilateral PIP and DIP joints.  Nail pitting and nail dystrophy was noted.  Hip joints were in good range of motion.  He had tenderness over bilateral trochanteric bursa.  Knee joints in good range of motion without any warmth swelling or effusion.  He had tenderness over bilateral plantar fascia.  He had tenderness over  bilateral first MTP joints.  No synovitis was noted.  PIP and DIP thickening was noted.  No nail dystrophy was noted in the toenails.  CDAI Exam: CDAI Score: -- Patient Global: --; Provider Global: -- Swollen: --; Tender: -- Joint Exam 08/26/2022   No joint exam has been documented for this visit   There is currently no information documented on the homunculus. Go to the Rheumatology activity and complete the homunculus joint exam.  Investigation: No additional findings.  Imaging: No results found.  Recent Labs: Lab Results  Component Value  Date   WBC 9.7 02/05/2022   HGB 16.8 02/05/2022   PLT 309 02/05/2022   NA 142 08/09/2021   K 4.7 08/09/2021   CL 103 08/09/2021   CO2 23 08/09/2021   GLUCOSE 93 08/09/2021   BUN 14 08/09/2021   CREATININE 1.08 08/09/2021   BILITOT 0.3 08/09/2021   ALKPHOS 64 08/09/2021   AST 17 08/09/2021   ALT 19 08/09/2021   PROT 7.8 08/09/2021   ALBUMIN 5.0 08/09/2021   CALCIUM 10.1 08/09/2021   Sep 11, 2021 RF negative, ESR 11, anti-CCP negative, CRP normal, ANA negative February 05, 2022 uric acid 8.3, RF 10.6, ESR 2  Speciality Comments: No specialty comments available.  Procedures:  No procedures performed Allergies: Patient has no known allergies.   Assessment / Plan:     Visit Diagnoses: Psoriatic arthropathy (HCC)-patient gives history of joint pain and intermittent swelling in his hands and feet for the last 10 years.  He states the symptoms occur gradually getting worse.  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  He has morning stiffness and the stiffness lasts all day.  He had tenderness over DIP joints in his hands and his feet.  Mild inflammation was noted in his hands.  Nail dystrophy and nail pitting was noted in his hands.  Tenderness over plantar fascia was noted.  Tenderness over bilateral  trochanteric bursa was noted.  No SI joint tenderness was noted.  Needle counts regarding psoriatic arthritis was provided.  Different treatment options and their side effects were discussed.  Patient and his wife are not using any contraception.  He has a 16-month-old baby.  He may be a good candidate for Tremfya.  Indications side effects contraindications of Tremfya were reviewed with the patient.  A handout was given and consent was taken.  Will apply for Tremfya once all the lab results are available.  Counseled patient that Tremfya is a IL-23 inhibitor.  Counseled patient on purpose, proper use, and adverse effects of Tremfya.  Reviewed the most common adverse effects including infection, URTIs, injection site reactions, nausea/diarrhea, and recurrence of tinea and HSV infections Counseled patient that Tremfya should be held for infection and prior to scheduled surgery.  Recommend annual influenza, PCV 15 or PCV20 or Pneumovax 23, and Shingrix as indicated.  Reviewed the importance of regular labs while on Tremfya therapy.  Will monitor CBC and CMP 1 month after starting and then every 3 months routinely thereafter. Will monitor TB gold annually. Standing orders placed.  Provided patient with medication education material and answered all questions.  Patient voiced understanding.  Patient consented to [facility].  Will upload consent into the media tab.  Reviewed storage instructions of Tremfya.  Advised initial injection must be administered in office.  Patient verbalized understanding.  Will apply for Tremfya through patient's insurance and update when we receive a response.  Dose will be Tremfya 100 mg on weeks 0 and 4 then every 8 weeks thereafter.  Prescription pending lab results and insurance approval.   Pain in both hands -he complains of pain and discomfort in his bilateral hands.  He had tenderness over PIP and DIP joints.  PIP and DIP thickening with nail pitting was also noted.  Plan: XR Hand 2  View Right, XR Hand 2 View Left x-rays showed osteoarthritic changes with some  erosive changes suggestive of possible psoriatic arthritis.  Trochanteric bursitis of both hips-he had tenderness over bilateral trochanteric bursa.  Chronic pain of both knees -he complains of discomfort in his bilateral knee joints.  No warmth swelling or effusion was noted.  Plan: XR KNEE 3 VIEW RIGHT, XR KNEE 3 VIEW LEFT.  X-rays of bilateral knee joints were unremarkable.  Pain in both feet -he had tenderness over bilateral first MTP joints and DIP joints.  No synovitis was noted.  Plan: XR Foot 2 Views Right, XR Foot 2 Views Left.  X-ray showed osteoarthritic changes with possible cystic versus erosive changes which raise concern of psoriatic arthritis.  Plantar fasciitis, bilateral-he had tenderness over bilateral plantar fascia.  Psoriasis-patient states he developed a rash on his scalp about a year ago and has been using topical clobetasol shampoo which is not helping him much.  Psoriasis rash was noted on his scalp.  Nail pitting and nail dystrophy was also noted.  There is no family history of psoriasis.  High risk medication use -in anticipation to start him on Tremfya I will obtain following labs today.  Plan: CBC with Differential/Platelet, COMPLETE METABOLIC PANEL WITH GFR, Hepatitis B core antibody, IgM, Hepatitis B surface antigen, Hepatitis C antibody, QuantiFERON-TB Gold Plus, Serum protein electrophoresis with reflex, IgG, IgA, IgM  Hyperuricemia -strong association of hyperuricemia and gout with psoriatic arthritis was also discussed.  Patient does not drink any alcohol.  I will repeat uric acid level today.  Plan: Uric acid  Elevated blood pressure reading-his blood pressure was elevated at 143/99.  Repeat blood pressure was 143/92.  He was advised to monitor blood pressure closely and follow-up with his PCP.  Anxiety and depression-he is currently not taking any medications.  Obesity (BMI  30.0-34.9)-increased risk of heart disease with psoriatic arthritis was discussed.  Dietary modifications were discussed.  Low testosterone-he is on anastrozole and testosterone injections.  Other fatigue-probably related to testosterone.  Orders: Orders Placed This Encounter  Procedures   XR Hand 2 View Right   XR Hand 2 View Left   XR Foot 2 Views Right   XR Foot 2 Views Left   XR KNEE 3 VIEW RIGHT   XR KNEE 3 VIEW LEFT   CBC with Differential/Platelet   COMPLETE METABOLIC PANEL WITH GFR   Hepatitis B core antibody, IgM   Hepatitis B surface antigen   Hepatitis C antibody   QuantiFERON-TB Gold Plus   Serum protein electrophoresis with reflex   IgG, IgA, IgM   Uric acid   No orders of the defined types were placed in this encounter.    Follow-Up Instructions: Return for Psoriasis, psoriatic arthritis.   Pollyann Savoy, MD  Note - This record has been created using Animal nutritionist.  Chart creation errors have been sought, but may not always  have been located. Such creation errors do not reflect on  the standard of medical care.

## 2022-08-25 ENCOUNTER — Other Ambulatory Visit: Payer: Self-pay | Admitting: Family Medicine

## 2022-08-25 DIAGNOSIS — M1990 Unspecified osteoarthritis, unspecified site: Secondary | ICD-10-CM

## 2022-08-26 ENCOUNTER — Ambulatory Visit (INDEPENDENT_AMBULATORY_CARE_PROVIDER_SITE_OTHER): Payer: BC Managed Care – PPO

## 2022-08-26 ENCOUNTER — Ambulatory Visit: Payer: BC Managed Care – PPO

## 2022-08-26 ENCOUNTER — Ambulatory Visit: Payer: BC Managed Care – PPO | Attending: Rheumatology | Admitting: Rheumatology

## 2022-08-26 ENCOUNTER — Telehealth: Payer: Self-pay | Admitting: Pharmacist

## 2022-08-26 ENCOUNTER — Encounter: Payer: Self-pay | Admitting: Rheumatology

## 2022-08-26 VITALS — BP 127/90 | HR 72 | Resp 17 | Ht 70.0 in | Wt 233.8 lb

## 2022-08-26 DIAGNOSIS — L4050 Arthropathic psoriasis, unspecified: Secondary | ICD-10-CM

## 2022-08-26 DIAGNOSIS — M7062 Trochanteric bursitis, left hip: Secondary | ICD-10-CM

## 2022-08-26 DIAGNOSIS — M79671 Pain in right foot: Secondary | ICD-10-CM

## 2022-08-26 DIAGNOSIS — M25562 Pain in left knee: Secondary | ICD-10-CM | POA: Diagnosis not present

## 2022-08-26 DIAGNOSIS — Z79899 Other long term (current) drug therapy: Secondary | ICD-10-CM | POA: Diagnosis not present

## 2022-08-26 DIAGNOSIS — M79642 Pain in left hand: Secondary | ICD-10-CM

## 2022-08-26 DIAGNOSIS — M79672 Pain in left foot: Secondary | ICD-10-CM

## 2022-08-26 DIAGNOSIS — F419 Anxiety disorder, unspecified: Secondary | ICD-10-CM

## 2022-08-26 DIAGNOSIS — R5383 Other fatigue: Secondary | ICD-10-CM

## 2022-08-26 DIAGNOSIS — G8929 Other chronic pain: Secondary | ICD-10-CM

## 2022-08-26 DIAGNOSIS — M79641 Pain in right hand: Secondary | ICD-10-CM | POA: Diagnosis not present

## 2022-08-26 DIAGNOSIS — R7989 Other specified abnormal findings of blood chemistry: Secondary | ICD-10-CM

## 2022-08-26 DIAGNOSIS — M25561 Pain in right knee: Secondary | ICD-10-CM

## 2022-08-26 DIAGNOSIS — Z1159 Encounter for screening for other viral diseases: Secondary | ICD-10-CM | POA: Diagnosis not present

## 2022-08-26 DIAGNOSIS — L409 Psoriasis, unspecified: Secondary | ICD-10-CM

## 2022-08-26 DIAGNOSIS — M7061 Trochanteric bursitis, right hip: Secondary | ICD-10-CM

## 2022-08-26 DIAGNOSIS — E790 Hyperuricemia without signs of inflammatory arthritis and tophaceous disease: Secondary | ICD-10-CM | POA: Diagnosis not present

## 2022-08-26 DIAGNOSIS — R030 Elevated blood-pressure reading, without diagnosis of hypertension: Secondary | ICD-10-CM

## 2022-08-26 DIAGNOSIS — E669 Obesity, unspecified: Secondary | ICD-10-CM

## 2022-08-26 DIAGNOSIS — M722 Plantar fascial fibromatosis: Secondary | ICD-10-CM

## 2022-08-26 DIAGNOSIS — F32A Depression, unspecified: Secondary | ICD-10-CM

## 2022-08-26 LAB — CBC WITH DIFFERENTIAL/PLATELET
Basophils Absolute: 66 cells/uL (ref 0–200)
Eosinophils Relative: 3.4 %
Hemoglobin: 16.5 g/dL (ref 13.2–17.1)
MPV: 9.8 fL (ref 7.5–12.5)
Neutrophils Relative %: 57.6 %
RBC: 5.95 10*6/uL — ABNORMAL HIGH (ref 4.20–5.80)

## 2022-08-26 NOTE — Patient Instructions (Addendum)
Psoriatic Arthritis Psoriatic arthritis, or PsA, is a long-term (chronic) condition that causes pain, swelling, and stiffness in the joints. The large joints of the legs, hips, and pelvis are most often affected but it can affect any joint in your body. Most people with PsA have a chronic skin disease that causes itchy scales and patches to form on the skin (psoriasis) before they develop PsA. In some cases, a person may have PsA before or without having psoriasis. PsA can be mild or severe. If untreated, PsA may cause joint damage. What are the causes? The exact cause of this condition is not known. PsA is an autoimmune disease. With this type of disease, the body's defense system (immune system) mistakenly attacks joints and the tissues that connect muscles to joints (tendons). The disease may be activated by a trigger, such as: Infections. Stress. Alcohol. What increases the risk? You are more likely to develop this condition if: You have psoriasis. You have a family history of psoriasis or PsA. You are between the ages of 1 and 39. What are the signs or symptoms? The main symptom of this condition is inflammation of joints and tendons. Other symptoms may include: Joint pain or swelling. Joint stiffness, especially in the morning. Swollen fingers and toes. Pain in areas where tendons connect to bones. Pitted and weak nails. Tiredness (fatigue). Eye redness. Symptoms of this condition vary from person to person. Symptoms may come and go. Sometimes, symptoms get worse for a period of time. This is called a flare. How is this diagnosed? This condition may be diagnosed based on: Your symptoms and medical history. A physical exam. Imaging tests such as an X-ray, a CT scan, or an MRI. Blood tests to look for inflammation and to rule out other causes, such as gout or rheumatoid arthritis. You may also be referred to a health care provider who specializes in treating this condition  (rheumatologist). How is this treated? The goal of treatment is to reduce pain and inflammation and to protect joints from damage. Treatment depends on how severe the inflammation is and how many joints are affected. Treatment may include: Medicines. NSAIDs, such as ibuprofen or naproxen. Steroids. These can be taken by mouth or injected into a swollen joint. Disease-modifying anti-rheumatic drugs (DMARDs). These slow the course of the disease. Biologic medicines. These medicines are usually given as injections or through an IV. They can be very effective, but these medicines can increase the risk of developing infections. Physical therapy and other exercises to: Strengthen muscles that support joints. Prevent joint stiffness. Lifestyle changes. It is important to rest as needed, eat a healthy diet, and exercise. A brace or splint to support a painful and swollen joint. Surgery if you have severe joint damage. Management of any associated medical conditions. Inflammation from PsA can affect other parts of the body, including the heart, eyes, or kidneys. Follow these instructions at home: Managing pain, stiffness, and swelling  If directed, put ice on painful areas. To do this: If you have a removable brace or splint, remove it as told by your health care provider. Put ice in a plastic bag. Place a towel between your skin and the bag. Leave the ice on for 20 minutes, 2-3 times a day. Remove the ice if your skin turns bright red. This is very important. If you cannot feel pain, heat, or cold, you have a greater risk of damage to the area. If you have a removable brace or splint: Wear the brace or  splint as told by your health care provider. Remove it only as told by your health care provider. Check the skin around the brace or splint every day. Tell your health care provider about any concerns. Loosen the brace or splint if your fingers or toes tingle, become numb, or turn cold and  blue. Keep the brace or splint clean and dry. Activity Return to your normal activities as told by your health care provider. Ask your health care provider what activities are safe for you. Get regular exercise. Ask your health care provider what type of exercise is best for you. Your health care provider may recommend: Low-impact exercises such as walking, biking, or swimming. Exercises that include stretching, such as tai chi and yoga. Do not exercise when you have a flare of symptoms. Rest until the symptoms improve. Lifestyle  Maintain a healthy weight. A healthy weight will help you stay active and take stress off your joints. Eat a healthy diet that includes plenty of vegetables, fruits, whole grains, low-fat dairy products, and lean protein. Do not eat a lot of foods that are high in solid fats, added sugars, or salt. Use techniques for stress reduction, such as meditation or yoga. Do not use any products that contain nicotine or tobacco. These products include cigarettes, chewing tobacco, and vaping devices, such as e-cigarettes. If you need help quitting, ask your health care provider. Consider joining a PsA support group. General instructions Take over-the-counter and prescription medicines only as told by your health care provider. Keep a journal to help track your symptoms. Try to avoid any triggers. Do not drink alcohol if your health care provider tells you not to drink. See a mental health therapist if you feel sad, anxious, frustrated, and hopeless. Managing this condition can be challenging and you may feel overwhelmed and depressed. Keep all follow-up visits. Your health care provider may monitor your condition over time to make sure that it does not cause problems or get worse. Where to find support National Psoriasis Foundation: www.psoriasis.org Where to find more information American Academy of Dermatology: InfoExam.si [employer]:  www.rheumatology.org Contact a health care provider if: You have a fever or chills. Your symptoms get worse. You feel depressed, frustrated, and hopeless about your condition. Get help right away if: You have thoughts of hurting yourself or others. Get help right away if you feel like you may hurt yourself or others, or have thoughts about taking your own life. Go to your nearest emergency room or: Call 911. Call the National Suicide Prevention Lifeline at (367)033-2524 or 988. This is open 24 hours a day. Text the Crisis Text Line at (973) 632-6150. Summary Psoriatic arthritis, or PsA, is a long-term condition that causes pain, swelling, and stiffness in the joints. The goal of treatment is to reduce pain and inflammation and to protect joints from damage. Treatment depends on how severe the inflammation is and how many joints are affected. This information is not intended to replace advice given to you by your health care provider. Make sure you discuss any questions you have with your health care provider. Document Revised: 05/27/2021 Document Reviewed: 05/27/2021 Elsevier Patient Education  2023 Elsevier Inc.  Guselkumab Injection What is this medication? GUSELKUMAB (goo ZELK ue mab) treats autoimmune conditions, such as arthritis and psoriasis. It works by slowing down an overactive immune system. It is a monoclonal antibody. This medicine may be used for other purposes; ask your health care provider or pharmacist if you have questions. COMMON  BRAND NAME(S): Tremfya What should I tell my care team before I take this medication? They need to know if you have any of these conditions: Immune system problems Infection, such as a virus infection, chickenpox, cold sores, herpes, or a history of infections Recently received or scheduled to receive a vaccine Tuberculosis, a positive skin test for tuberculosis, or recent close contact with someone who has tuberculosis An unusual or allergic  reaction to guselkumab, other medications, foods, dyes, or preservatives Pregnant or trying to get pregnant Breast-feeding How should I use this medication? This medication is injected under the skin. It is usually given by a care team in a hospital or clinic setting. It may also be given at home. If you get this medication at home, you will be taught how to prepare and give it. Use exactly as directed. Take it as directed on the prescription label. Keep taking it unless your care team tells you to stop. It is important that you put your used needles and syringes in a special sharps container. Do not put them in a trash can. If you do not have a sharps container, call your pharmacist or care team to get one. This medication comes with INSTRUCTIONS FOR USE. Ask your pharmacist for directions on how to use this medication. Read the information carefully. Talk to your pharmacist or care team if you have questions. A special MedGuide will be given to you by the pharmacist with each prescription and refill. Be sure to read this information carefully each time. Talk to your care team about the use of this medication in children. Special care may be needed. Overdosage: If you think you have taken too much of this medicine contact a poison control center or emergency room at once. NOTE: This medicine is only for you. Do not share this medicine with others. What if I miss a dose? If you get this medication at a hospital or clinic: It is important not to miss your dose. Call your care team if you are unable to keep an appointment. If you give yourself this medication at home: If you miss a dose, take it as soon as you can. Then continue your normal schedule. If it is almost time for your next dose, take only that dose. Do not take double or extra doses. Call your care team with questions. What may interact with this medication? Do not take this medication with any of the following: Live virus vaccines This  medication may also interact with the following: Amoxapine Certain medications for depression, anxiety, or mental health conditions, such as amitriptyline, clomipramine, desipramine, doxepin, imipramine, maprotiline, nortriptyline, protriptyline, trimipramine Codeine Inactivated vaccines Methadone Pimozide Thioridazine This list may not describe all possible interactions. Give your health care provider a list of all the medicines, herbs, non-prescription drugs, or dietary supplements you use. Also tell them if you smoke, drink alcohol, or use illegal drugs. Some items may interact with your medicine. What should I watch for while using this medication? Your condition will be monitored carefully while you are receiving this medication. Tell your care team if your symptoms do not start to get better or if they get worse. You will be tested for tuberculosis (TB) before you start this medication. If your care team prescribes any medication for TB, you should start taking the TB medication before starting this medication. Make sure to finish the full course of TB medication. This medication may increase your risk of getting an infection. Call your care team for  advice if you get a fever, chills, sore throat, or other symptoms of a cold or flu. Do not treat yourself. Try to avoid being around people who are sick. What side effects may I notice from receiving this medication? Side effects that you should report to your care team as soon as possible: Allergic reactions--skin rash, itching, hives, swelling of the face, lips, tongue, or throat Infection--fever, chills, cough, sore throat, wounds that don't heal, pain or trouble when passing urine, general feeling of discomfort or being unwell Side effects that usually do not require medical attention (report to your care team if they continue or are bothersome): Diarrhea Headache Joint pain Pain, redness, or irritation at injection site Runny or stuffy  nose Sore throat This list may not describe all possible side effects. Call your doctor for medical advice about side effects. You may report side effects to FDA at 1-800-FDA-1088. Where should I keep my medication? Keep out of the reach of children and pets. Store in the refrigerator. Do not freeze. Keep it in the original container until you are ready to use. Protect from light. Do not shake. Remove the dose from the refrigerator about 30 minutes prior to use. Get rid of any unused medication after the expiration date on the label. To get rid of medications that are no longer needed or have expired: Take the medication to a medication take-back program. Check with your pharmacy or law enforcement to find a location. If you cannot return the medication, ask your pharmacist or care team how to get rid of this medication safely. NOTE: This sheet is a summary. It may not cover all possible information. If you have questions about this medicine, talk to your doctor, pharmacist, or health care provider.  2023 Elsevier/Gold Standard (2021-06-19 00:00:00)  Vaccines You are taking a medication(s) that can suppress your immune system.  The following immunizations are recommended: Flu annually Covid-19  Td/Tdap (tetanus, diphtheria, pertussis) every 10 years Pneumonia (Prevnar 15 then Pneumovax 23 at least 1 year apart.  Alternatively, can take Prevnar 20 without needing additional dose) Shingrix: 2 doses from 4 weeks to 6 months apart  Please check with your PCP to make sure you are up to date.   If you have signs or symptoms of an infection or start antibiotics: First, call your PCP for workup of your infection. Hold your medication through the infection, until you complete your antibiotics, and until symptoms resolve if you take the following: Injectable medication (Actemra, Benlysta, Cimzia, Cosentyx, Enbrel, Humira, Kevzara, Orencia, Remicade, Simponi, Stelara, Taltz,  Tremfya) Methotrexate Leflunomide (Arava) Mycophenolate (Cellcept) Harriette Ohara, Olumiant, or Rinvoq  Heart Disease Prevention   Your inflammatory disease increases your risk of heart disease which includes heart attack, stroke, atrial fibrillation (irregular heartbeats), high blood pressure, heart failure and atherosclerosis (plaque in the arteries).  It is important to reduce your risk by:   Keep blood pressure, cholesterol, and blood sugar at healthy levels   Smoking Cessation   Maintain a healthy weight  BMI 20-25   Eat a healthy diet  Plenty of fresh fruit, vegetables, and whole grains  Limit saturated fats, foods high in sodium, and added sugars  DASH and Mediterranean diet   Increase physical activity  Recommend moderate physically activity for 150 minutes per week/ 30 minutes a day for five days a week These can be broken up into three separate ten-minute sessions during the day.   Reduce Stress  Meditation, slow breathing exercises, yoga, coloring books  Dental  visits twice a year

## 2022-08-26 NOTE — Telephone Encounter (Addendum)
Pending baseline labs, please start Tremfya BIV  Dose: 100mg  at Week 0, Week 4, then every 8 weeks  Dx: PsA, psoriasis    ----- Message from Ellen Henri, CMA sent at 08/26/2022 11:28 AM EDT ----- Please apply for tremfya, per Dr. Corliss Skains.  Consent was obtained and sent to the scan center. Thanks!   Meliana Canner----could you please call patient to discuss. He plans on reading the information provided and would like to speak with you about the medication. Thanks!

## 2022-08-27 LAB — COMPLETE METABOLIC PANEL WITH GFR
ALT: 37 U/L (ref 9–46)
AST: 22 U/L (ref 10–40)
BUN: 12 mg/dL (ref 7–25)
Glucose, Bld: 85 mg/dL (ref 65–99)
Total Protein: 7.5 g/dL (ref 6.1–8.1)

## 2022-08-27 LAB — CBC WITH DIFFERENTIAL/PLATELET
Basophils Relative: 0.8 %
Lymphs Abs: 2664 cells/uL (ref 850–3900)
MCH: 27.7 pg (ref 27.0–33.0)
MCV: 83.2 fL (ref 80.0–100.0)
RDW: 13.5 % (ref 11.0–15.0)
Total Lymphocyte: 32.1 %

## 2022-08-27 LAB — IGG, IGA, IGM: IgG (Immunoglobin G), Serum: 1230 mg/dL (ref 600–1640)

## 2022-08-27 LAB — HEPATITIS B CORE ANTIBODY, IGM: Hep B C IgM: NONREACTIVE

## 2022-08-28 ENCOUNTER — Other Ambulatory Visit (HOSPITAL_COMMUNITY): Payer: Self-pay

## 2022-08-28 LAB — COMPLETE METABOLIC PANEL WITH GFR
Alkaline phosphatase (APISO): 43 U/L (ref 36–130)
Calcium: 9.7 mg/dL (ref 8.6–10.3)

## 2022-08-28 LAB — IGG, IGA, IGM: Immunoglobulin A: 242 mg/dL (ref 47–310)

## 2022-08-28 LAB — QUANTIFERON-TB GOLD PLUS: NIL: 0.04 IU/mL

## 2022-08-28 LAB — CBC WITH DIFFERENTIAL/PLATELET
MCHC: 33.3 g/dL (ref 32.0–36.0)
WBC: 8.3 10*3/uL (ref 3.8–10.8)

## 2022-08-28 NOTE — Telephone Encounter (Signed)
Received notification from [facility]  regarding a prior authorization for [facility]. Authorization has been APPROVED from 08/28/22 to 02/28/23. Approval letter sent to scan center.  Unable to run test claim because patient must fill through [facility]: [phone]   Authorization # [id number] Phone # [phone]  Enrolled patient into Tremfya copay card: BIN: [id number] Group: [id number] ID: [id number]  Chesley Mires, PharmD, MPH, BCPS, CPP Clinical Pharmacist (Rheumatology and Pulmonology)

## 2022-08-28 NOTE — Telephone Encounter (Signed)
.  Submitted a Prior Authorization request to [facility] for [facility] via CoverMyMeds. Will update once we receive a response.  Key: [id number]

## 2022-08-29 LAB — COMPLETE METABOLIC PANEL WITH GFR
AG Ratio: 1.7 (calc) (ref 1.0–2.5)
Albumin: 4.7 g/dL (ref 3.6–5.1)
CO2: 23 mmol/L (ref 20–32)
Chloride: 103 mmol/L (ref 98–110)
Creat: 1.02 mg/dL (ref 0.60–1.26)
Globulin: 2.8 g/dL (calc) (ref 1.9–3.7)
Potassium: 4.5 mmol/L (ref 3.5–5.3)
Sodium: 138 mmol/L (ref 135–146)
Total Bilirubin: 0.6 mg/dL (ref 0.2–1.2)
eGFR: 96 mL/min/{1.73_m2} (ref >=60)

## 2022-08-29 LAB — HEPATITIS C ANTIBODY: Hepatitis C Ab: NONREACTIVE

## 2022-08-29 LAB — PROTEIN ELECTROPHORESIS, SERUM, WITH REFLEX
Albumin ELP: 4.7 g/dL (ref 3.8–4.8)
Alpha 1: 0.2 g/dL (ref 0.2–0.3)
Alpha 2: 0.7 g/dL (ref 0.5–0.9)
Beta 2: 0.4 g/dL (ref 0.2–0.5)
Beta Globulin: 0.5 g/dL (ref 0.4–0.6)
Gamma Globulin: 1.1 g/dL (ref 0.8–1.7)
Total Protein: 7.6 g/dL (ref 6.1–8.1)

## 2022-08-29 LAB — HEPATITIS B SURFACE ANTIGEN: Hepatitis B Surface Ag: NONREACTIVE

## 2022-08-29 LAB — CBC WITH DIFFERENTIAL/PLATELET
Absolute Monocytes: 506 cells/uL (ref 200–950)
Eosinophils Absolute: 282 cells/uL (ref 15–500)
HCT: 49.5 % (ref 38.5–50.0)
Monocytes Relative: 6.1 %
Neutro Abs: 4781 cells/uL (ref 1500–7800)
Platelets: 301 10*3/uL (ref 140–400)

## 2022-08-29 LAB — QUANTIFERON-TB GOLD PLUS
Mitogen-NIL: 7.81 IU/mL
QuantiFERON-TB Gold Plus: NEGATIVE
TB1-NIL: <0 IU/mL
TB2-NIL: <0 IU/mL

## 2022-08-29 LAB — IGG, IGA, IGM: IgM, Serum: 59 mg/dL (ref 50–300)

## 2022-08-29 LAB — URIC ACID: Uric Acid, Serum: 6.4 mg/dL (ref 4.0–8.0)

## 2022-08-29 NOTE — Telephone Encounter (Signed)
Spoke with patient - he really had no questions. He was concerned about if Tremfya causes increase in blood sugar.  Advised it does not.  Patient is scheduled for Tremfya new start on 09/02/22. Will use sample. Patient denies active infection and active antibiotic use.  Chesley Mires, PharmD, MPH, BCPS, CPP Clinical Pharmacist (Rheumatology and Pulmonology)

## 2022-08-31 NOTE — Progress Notes (Signed)
CBC normal, CMP normal, TB Gold negative, hepatitis B negative, hepatitis C negative, SPEP normal, immunoglobulins normal, uric acid 6.4.  I will discuss results at the follow-up visit.

## 2022-09-02 ENCOUNTER — Ambulatory Visit: Payer: BC Managed Care – PPO | Attending: Rheumatology | Admitting: Pharmacist

## 2022-09-02 DIAGNOSIS — L4050 Arthropathic psoriasis, unspecified: Secondary | ICD-10-CM

## 2022-09-02 DIAGNOSIS — Z79899 Other long term (current) drug therapy: Secondary | ICD-10-CM

## 2022-09-02 DIAGNOSIS — L409 Psoriasis, unspecified: Secondary | ICD-10-CM

## 2022-09-02 DIAGNOSIS — Z7189 Other specified counseling: Secondary | ICD-10-CM

## 2022-09-02 MED ORDER — TREMFYA 100 MG/ML ~~LOC~~ SOAJ
SUBCUTANEOUS | 1 refills | Status: DC
Start: 2022-09-02 — End: 2022-12-29

## 2022-09-02 NOTE — Patient Instructions (Signed)
Your next TREMFYA dose is due on 09/30/22, then every 8 weeks thereafter (starting on 11/25/22)  HOLD TREMFYA if you have signs or symptoms of an infection. You can resume once you feel better or back to your baseline. HOLD TREMFYA if you start antibiotics to treat an infection. HOLD TREMFYA around the time of surgery/procedures. Your surgeon will be able to provide recommendations on when to hold BEFORE and when you are cleared to RESUME.  Pharmacy information: Your prescription will be shipped from [facility]. Their phone number is [phone] Please call to schedule shipment and confirm address. They will mail your medication to your home.  Cost information: Your copay should be affordable. If you call the pharmacy and it is not affordable, please double-check that they are billing through your copay card as secondary coverage. That copay card information is: BIN: [id number] Group: [id number] ID: [id number]  Labs are due in 1 month then every 3 months. Lab hours are from Monday to Thursday 8am-12:30pm and 1pm-5pm and Friday 8am-12pm. You do not need an appointment if you come for labs during these times.  How to manage an injection site reaction: Remember the 5 C's: COUNTER - leave on the counter at least 30 minutes but up to overnight to bring medication to room temperature. This may help prevent stinging COLD - place something cold (like an ice gel pack or cold water bottle) on the injection site just before cleansing with alcohol. This may help reduce pain CLARITIN - use Claritin (generic name is loratadine) for the first two weeks of treatment or the day of, the day before, and the day after injecting. This will help to minimize injection site reactions CORTISONE CREAM - apply if injection site is irritated and itching CALL ME - if injection site reaction is bigger than the size of your fist, looks infected, blisters, or if you develop hives

## 2022-09-02 NOTE — Progress Notes (Signed)
Pharmacy Note  Subjective:   Patient presents to clinic today to receive first dose of Tremfya for psoriatic arthritis + psoriasis. Patient currently takes diclofenac. He is naive to injectables and DMARDs  Patient running a fever or have signs/symptoms of infection? No  Patient currently on antibiotics for the treatment of infection? No  Patient have any upcoming invasive procedures/surgeries? No  Objective: CMP     Component Value Date/Time   NA 138 08/26/2022 0927   NA 142 08/09/2021 1122   K 4.5 08/26/2022 0927   CL 103 08/26/2022 0927   CO2 23 08/26/2022 0927   GLUCOSE 85 08/26/2022 0927   BUN 12 08/26/2022 0927   BUN 14 08/09/2021 1122   CREATININE 1.02 08/26/2022 0927   CALCIUM 9.7 08/26/2022 0927   PROT 7.5 08/26/2022 0927   PROT 7.6 08/26/2022 0927   PROT 7.8 08/09/2021 1122   ALBUMIN 5.0 08/09/2021 1122   AST 22 08/26/2022 0927   ALT 37 08/26/2022 0927   ALKPHOS 64 08/09/2021 1122   BILITOT 0.6 08/26/2022 0927   BILITOT 0.3 08/09/2021 1122    CBC    Component Value Date/Time   WBC 8.3 08/26/2022 0927   RBC 5.95 (H) 08/26/2022 0927   HGB 16.5 08/26/2022 0927   HGB 16.8 02/05/2022 0910   HCT 49.5 08/26/2022 0927   HCT 50.5 02/05/2022 0910   PLT 301 08/26/2022 0927   PLT 309 02/05/2022 0910   MCV 83.2 08/26/2022 0927   MCV 84 02/05/2022 0910   MCH 27.7 08/26/2022 0927   MCHC 33.3 08/26/2022 0927   RDW 13.5 08/26/2022 0927   RDW 12.9 02/05/2022 0910   LYMPHSABS 2,664 08/26/2022 0927   LYMPHSABS 2.5 02/05/2022 0910   EOSABS 282 08/26/2022 0927   EOSABS 0.5 (H) 02/05/2022 0910   BASOSABS 66 08/26/2022 0927   BASOSABS 0.1 02/05/2022 0910    Baseline Immunosuppressant Therapy Labs TB GOLD    Latest Ref Rng & Units 08/26/2022    9:27 AM  Quantiferon TB Gold  Quantiferon TB Gold Plus NEGATIVE NEGATIVE    Hepatitis Panel    Latest Ref Rng & Units 08/26/2022    9:27 AM  Hepatitis  Hep B Surface Ag NON-REACTIVE NON-REACTIVE   Hep B IgM NON-REACTIVE  NON-REACTIVE   Hep C Ab NON-REACTIVE NON-REACTIVE    HIV No results found for: "HIV" Immunoglobulins    Latest Ref Rng & Units 08/26/2022    9:27 AM  Immunoglobulin Electrophoresis  IgA  47 - 310 mg/dL 161   IgG 096 - 0,454 mg/dL 0,981   IgM 50 - 191 mg/dL 59    SPEP    Latest Ref Rng & Units 08/26/2022    9:27 AM  Serum Protein Electrophoresis  Total Protein 6.1 - 8.1 g/dL 6.1 - 8.1 g/dL 7.5    7.6   Albumin 3.8 - 4.8 g/dL 4.7   Alpha-1 0.2 - 0.3 g/dL 0.2   Alpha-2 0.5 - 0.9 g/dL 0.7   Beta Globulin 0.4 - 0.6 g/dL 0.5   Beta 2 0.2 - 0.5 g/dL 0.4   Gamma Globulin 0.8 - 1.7 g/dL 1.1    Assessment/Plan:  Reviewed importance of holding TREMFYA with signs/symptoms of an infections, if antibiotics are prescribed to treat an active infection, and with invasive procedures. Reviewed that [facility] does not affect glucose or blood pressure.  Demonstrated proper injection technique with TREMFYA demo device  Patient able to demonstrate proper injection technique using the teach back method.  Patient self injected  in the right upper thigh with:  Sample Medication: Tremfya 100mg /mL autoinjector NDC: [phone] Lot: [id number] Expiration: 12/2022  Patient tolerated well.  Observed for 30 mins in office for adverse reaction and none noted. Patient denies itchiness and irritation. No redness or swelling noted at injection site.  Patient is to return in 1 month for labs and 6-8 weeks for follow-up appointment.  Standing orders for CBC/CMP placed.  TB gold will be monitored yearly.   TREMFYA approved through insurance .   Rx sent to: [facility]: [phone] .  Patient provided with pharmacy phone number and advised to call later this week to schedule shipment to home.  Patient will continue Tremfya 100mg  subcut at Week 0 (administered in clinic today), Week 4, then every 8 weeks.  All questions encouraged and answered.  Instructed patient to call with any further questions or  concerns.  Chesley Mires, PharmD, MPH, BCPS, CPP Clinical Pharmacist (Rheumatology and Pulmonology)  09/02/2022 8:03 AM

## 2022-09-23 ENCOUNTER — Ambulatory Visit: Payer: BC Managed Care – PPO | Admitting: Rheumatology

## 2022-10-02 NOTE — Progress Notes (Signed)
Office Visit Note  Patient: Tommy Cook             Date of Birth: 11-Feb-1986           MRN: [id number]             PCP: Arrie Senate, FNP Referring: Arrie Senate* Visit Date: 10/16/2022 Occupation: @GUAROCC @  Subjective:  Medication management  History of Present Illness: Tommy Cook is a 37 y.o. male with psoriatic arthritis, psoriasis and osteoarthritis.  He was started on Tremfya on Sep 02, 2022.  He had 2 doses of Tremfya so far.  Patient states that the psoriasis has completely cleared up.  He has noted improvement in the joint pain as well.  He still have stiffness and discomfort in his bilateral hands and bilateral feet.  He has not noticed any joint swelling.  He stopped diclofenac.    Activities of Daily Living:  Patient reports morning stiffness for all day. Patient Reports nocturnal pain.  Difficulty dressing/grooming: Denies Difficulty climbing stairs: Denies Difficulty getting out of chair: Denies Difficulty using hands for taps, buttons, cutlery, and/or writing: Denies  Review of Systems  Constitutional:  Positive for fatigue.  HENT:  Positive for mouth dryness. Negative for mouth sores.   Eyes:  Negative for dryness.  Respiratory:  Negative for shortness of breath.   Cardiovascular:  Negative for chest pain and palpitations.  Gastrointestinal:  Negative for blood in stool, constipation and diarrhea.  Endocrine: Positive for increased urination.  Genitourinary:  Negative for painful urination and involuntary urination.  Musculoskeletal:  Positive for joint pain, joint pain, myalgias, morning stiffness, muscle tenderness and myalgias. Negative for gait problem, joint swelling and muscle weakness.  Skin:  Negative for color change, rash, hair loss and sensitivity to sunlight.  Allergic/Immunologic: Negative for susceptible to infections.  Neurological:  Positive for headaches. Negative for dizziness.  Hematological:  Negative for  swollen glands.  Psychiatric/Behavioral:  Positive for sleep disturbance. Negative for depressed mood. The patient is not nervous/anxious.     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   Anxiety 08/09/2021   Arthritis 08/09/2021   Always tired 08/09/2021   Deviated septum 08/09/2021   Obesity (BMI 30.0-34.9) 08/09/2021    Past Medical History:  Diagnosis Date   Anxiety    Depression    Low testosterone 08/14/2021    Family History  Problem Relation Age of Onset   Hypertension Mother    Other Mother        BRCA1 positive   Hypertension Father    Heart attack Maternal Grandfather    Heart attack Paternal Grandfather    Healthy Daughter    Healthy Son    Healthy Son    Past Surgical History:  Procedure Laterality Date   TYMPANOSTOMY TUBE PLACEMENT Bilateral    as a child   WISDOM TOOTH EXTRACTION     Social History   Social History Narrative   Not on file   Immunization History  Administered Date(s) Administered   Tdap 09/03/2001     Objective: Vital Signs: BP (!) 136/90 (BP Location: Left Arm, Patient Position: Sitting, Cuff Size: Large)   Pulse 74   Resp 18   Ht 5\' 10"  (1.778 m)  Wt 226 lb 9.6 oz (102.8 kg)   BMI 32.51 kg/m    Physical Exam Vitals and nursing note reviewed.  Constitutional:      Appearance: He is well-developed.  HENT:     Head: Normocephalic and atraumatic.  Eyes:     Conjunctiva/sclera: Conjunctivae normal.     Pupils: Pupils are equal, round, and reactive to light.  Cardiovascular:     Rate and Rhythm: Normal rate and regular rhythm.     Heart sounds: Normal heart sounds.  Pulmonary:     Effort: Pulmonary effort is normal.     Breath sounds: Normal breath sounds.  Abdominal:      General: Bowel sounds are normal.     Palpations: Abdomen is soft.  Musculoskeletal:     Cervical back: Normal range of motion and neck supple.  Skin:    General: Skin is warm and dry.     Capillary Refill: Capillary refill takes less than 2 seconds.     Comments: Nail pitting and nail dystrophy was noted.  Neurological:     Mental Status: He is alert and oriented to person, place, and time.  Psychiatric:        Behavior: Behavior normal.      Musculoskeletal Exam: Cervical, thoracic and lumbar spine were in good range of motion.  He had no SI joint tenderness.  Shoulder joints, elbow joints, wrist joints were in good range of motion.  He had bilateral PIP and DIP thickening with no synovitis.  Hip joints and knee joints were in good range of motion without any warmth swelling or effusion.  There was no tenderness over trochanteric bursa.  There was no tenderness over ankles or MTPs.  There was no tenderness over Achilles tendon or plantar fascia.  CDAI Exam: CDAI Score: -- Patient Global: --; Provider Global: -- Swollen: --; Tender: -- Joint Exam 10/16/2022   No joint exam has been documented for this visit   There is currently no information documented on the homunculus. Go to the Rheumatology activity and complete the homunculus joint exam.  Investigation: No additional findings.  Imaging: No results found.  Recent Labs: Lab Results  Component Value Date   WBC 8.3 08/26/2022   HGB 16.5 08/26/2022   PLT 301 08/26/2022   NA 138 08/26/2022   K 4.5 08/26/2022   CL 103 08/26/2022   CO2 23 08/26/2022   GLUCOSE 85 08/26/2022   BUN 12 08/26/2022   CREATININE 1.02 08/26/2022   BILITOT 0.6 08/26/2022   ALKPHOS 64 08/09/2021   AST 22 08/26/2022   ALT 37 08/26/2022   PROT 7.5 08/26/2022   PROT 7.6 08/26/2022   ALBUMIN 5.0 08/09/2021   CALCIUM 9.7 08/26/2022   QFTBGOLDPLUS NEGATIVE 08/26/2022   May/07/24 SPEP normal, immunoglobulins normal, TB Gold negative, hepatitis B  negative, hepatitis C negative, uric acid 6.4  Sep 11, 2021 RF negative, ESR 11, anti-CCP negative, CRP normal, ANA negative February 05, 2022 uric acid 8.3, RF 10.6, ESR 2  Speciality Comments: Tremfya started 09/02/22  Procedures:  No procedures performed Allergies: Patient has no known allergies.   Assessment / Plan:     Visit Diagnoses: Psoriatic arthropathy (HCC) - History of intermittent swelling.  Synovitis was noted.  Trochanteric bursitis, Planter fasciitis.  Nail pitting and nail dystrophy noted.  Patient has been on Tremfya since Sep 02, 2022.  He noted improvement in joint inflammation and swelling.  He discontinued diclofenac.  He continues to have some discomfort in his hands  and feet.  He denies any tenderness over trochanteric bursa and plantar fascia today.  Psoriasis - Psoriasis patches on his scalp have completely resolved.   Nail pitting and nail dystrophy was noted.  Patient notices improvement in the nail dystrophy.  High risk medication use - Tremfya 100 mg subcutaneous every 8 weeks.  Started on Sep 02, 2022.- Plan: CBC with Differential/Platelet, COMPLETE METABOLIC PANEL WITH GFR today and then every 3 months.  Information on immunization was placed in the AVS.  He was advised to hold Tremfya if he develops an infection resume after the infection resolves.  Pain in both hands - History of pain and discomfort in bilateral hands.  The pain and discomfort has improved since he has been on Tremfya.  No synovitis was noted today.  X-rays showed PIP and DIP thickening with  erosive changes.  X-ray findings were reviewed with the patient.  Trochanteric bursitis of both hips-patient had tenderness on the initial visit which is completely resolved.  Chronic pain of both knees -knee joint pain has improved.  No warmth swelling or effusion was noted.  X-rays of bilateral knee joints were unremarkable.  X-ray findings were reviewed with the patient.  Pain in both feet -he had  tenderness over bilateral MTPs and DIP joints at the initial visit.  No synovitis was noted.  He still have some discomfort in his feet which most likely is due to underlying osteoarthritis.  X-rays obtained at the last visit showed osteoarthritic changes with possible cystic changes.  Plantar fasciitis, bilateral-resolved.  He does not have any plantar fascia tenderness today.  Anxiety and depression  Low testosterone  Obesity (BMI 30.0-34.9)-increased risk of heart disease with psoriatic arthritis was discussed.  Dietary modifications were discussed.  A handout was placed in the AVS.  Orders: Orders Placed This Encounter  Procedures   CBC with Differential/Platelet   COMPLETE METABOLIC PANEL WITH GFR   No orders of the defined types were placed in this encounter.    Follow-Up Instructions: Return in about 3 months (around 01/16/2023) for Psoriatic arthritis.   Pollyann Savoy, MD  Note - This record has been created using Animal nutritionist.  Chart creation errors have been sought, but may not always  have been located. Such creation errors do not reflect on  the standard of medical care.

## 2022-10-16 ENCOUNTER — Ambulatory Visit: Payer: BC Managed Care – PPO | Attending: Rheumatology | Admitting: Rheumatology

## 2022-10-16 ENCOUNTER — Encounter: Payer: Self-pay | Admitting: Rheumatology

## 2022-10-16 VITALS — BP 136/90 | HR 74 | Resp 18 | Ht 70.0 in | Wt 226.6 lb

## 2022-10-16 DIAGNOSIS — F419 Anxiety disorder, unspecified: Secondary | ICD-10-CM

## 2022-10-16 DIAGNOSIS — M79642 Pain in left hand: Secondary | ICD-10-CM

## 2022-10-16 DIAGNOSIS — L4050 Arthropathic psoriasis, unspecified: Secondary | ICD-10-CM

## 2022-10-16 DIAGNOSIS — Z79899 Other long term (current) drug therapy: Secondary | ICD-10-CM

## 2022-10-16 DIAGNOSIS — M25562 Pain in left knee: Secondary | ICD-10-CM

## 2022-10-16 DIAGNOSIS — L409 Psoriasis, unspecified: Secondary | ICD-10-CM | POA: Diagnosis not present

## 2022-10-16 DIAGNOSIS — F32A Depression, unspecified: Secondary | ICD-10-CM

## 2022-10-16 DIAGNOSIS — E66811 Obesity, class 1: Secondary | ICD-10-CM

## 2022-10-16 DIAGNOSIS — E669 Obesity, unspecified: Secondary | ICD-10-CM

## 2022-10-16 DIAGNOSIS — M7062 Trochanteric bursitis, left hip: Secondary | ICD-10-CM

## 2022-10-16 DIAGNOSIS — M79641 Pain in right hand: Secondary | ICD-10-CM

## 2022-10-16 DIAGNOSIS — M25561 Pain in right knee: Secondary | ICD-10-CM

## 2022-10-16 DIAGNOSIS — M7061 Trochanteric bursitis, right hip: Secondary | ICD-10-CM

## 2022-10-16 DIAGNOSIS — M79671 Pain in right foot: Secondary | ICD-10-CM

## 2022-10-16 DIAGNOSIS — R7989 Other specified abnormal findings of blood chemistry: Secondary | ICD-10-CM

## 2022-10-16 DIAGNOSIS — M722 Plantar fascial fibromatosis: Secondary | ICD-10-CM

## 2022-10-16 DIAGNOSIS — G8929 Other chronic pain: Secondary | ICD-10-CM

## 2022-10-16 DIAGNOSIS — M79672 Pain in left foot: Secondary | ICD-10-CM

## 2022-10-16 DIAGNOSIS — E790 Hyperuricemia without signs of inflammatory arthritis and tophaceous disease: Secondary | ICD-10-CM

## 2022-10-16 LAB — CBC WITH DIFFERENTIAL/PLATELET
Eosinophils Absolute: 221 cells/uL (ref 15–500)
Hemoglobin: 16.4 g/dL (ref 13.2–17.1)
MCH: 28.3 pg (ref 27.0–33.0)
MPV: 10.4 fL (ref 7.5–12.5)
Monocytes Relative: 5.7 %
WBC: 9.6 10*3/uL (ref 3.8–10.8)

## 2022-10-16 NOTE — Patient Instructions (Addendum)
Standing Labs We placed an order today for your standing lab work.   Please have your standing labs drawn in September and every 3 months  Please have your labs drawn 2 weeks prior to your appointment so that the provider can discuss your lab results at your appointment, if possible.  Please note that you may see your imaging and lab results in MyChart before we have reviewed them. We will contact you once all results are reviewed. Please allow our office up to 72 hours to thoroughly review all of the results before contacting the office for clarification of your results.  WALK-IN LAB HOURS  Monday through Thursday from 8:00 am -12:30 pm and 1:00 pm-5:00 pm and Friday from 8:00 am-12:00 pm.  Patients with office visits requiring labs will be seen before walk-in labs.  You may encounter longer than normal wait times. Please allow additional time. Wait times may be shorter on  Monday and Thursday afternoons.  We do not book appointments for walk-in labs. We appreciate your patience and understanding with our staff.   Labs are drawn by [facility]. Please bring your co-pay at the time of your lab draw.  You may receive a bill from [facility] for your lab work.  Please note if you are on Hydroxychloroquine and and an order has been placed for a Hydroxychloroquine level,  you will need to have it drawn 4 hours or more after your last dose.  If you wish to have your labs drawn at another location, please call the office 24 hours in advance so we can fax the orders.  The office is located at [address], [place], [place] [address]   If you have any questions regarding directions or hours of operation,  please call [phone].   As a reminder, please drink plenty of water prior to coming for your lab work. Thanks!   Vaccines You are taking a medication(s) that can suppress your immune system.  The following immunizations are recommended: Flu annually Covid-19  Td/Tdap (tetanus,  diphtheria, pertussis) every 10 years Pneumonia (Prevnar 15 then Pneumovax 23 at least 1 year apart.  Alternatively, can take Prevnar 20 without needing additional dose) Shingrix: 2 doses from 4 weeks to 6 months apart  Please check with your PCP to make sure you are up to date.   If you have signs or symptoms of an infection or start antibiotics: First, call your PCP for workup of your infection. Hold your medication through the infection, until you complete your antibiotics, and until symptoms resolve if you take the following: Injectable medication (Actemra, Benlysta, Cimzia, Cosentyx, Enbrel, Humira, Kevzara, Orencia, Remicade, Simponi, Stelara, Taltz, Tremfya) Methotrexate Leflunomide (Arava) Mycophenolate (Cellcept) Harriette Ohara, Olumiant, or Rinvoq  Heart Disease Prevention   Your inflammatory disease increases your risk of heart disease which includes heart attack, stroke, atrial fibrillation (irregular heartbeats), high blood pressure, heart failure and atherosclerosis (plaque in the arteries).  It is important to reduce your risk by:   Keep blood pressure, cholesterol, and blood sugar at healthy levels   Smoking Cessation   Maintain a healthy weight  BMI 20-25   Eat a healthy diet  Plenty of fresh fruit, vegetables, and whole grains  Limit saturated fats, foods high in sodium, and added sugars  DASH and Mediterranean diet   Increase physical activity  Recommend moderate physically activity for 150 minutes per week/ 30 minutes a day for five days a week These can be broken up into three separate ten-minute sessions  during the day.   Reduce Stress  Meditation, slow breathing exercises, yoga, coloring books  Dental visits twice a year

## 2022-10-17 LAB — CBC WITH DIFFERENTIAL/PLATELET
Absolute Monocytes: 547 cells/uL (ref 200–950)
Basophils Absolute: 67 cells/uL (ref 0–200)
Basophils Relative: 0.7 %
Eosinophils Relative: 2.3 %
HCT: 49 % (ref 38.5–50.0)
Lymphs Abs: 2573 cells/uL (ref 850–3900)
MCHC: 33.5 g/dL (ref 32.0–36.0)
MCV: 84.5 fL (ref 80.0–100.0)
Neutro Abs: 6192 cells/uL (ref 1500–7800)
Neutrophils Relative %: 64.5 %
Platelets: 303 10*3/uL (ref 140–400)
RBC: 5.8 10*6/uL (ref 4.20–5.80)
RDW: 13.1 % (ref 11.0–15.0)
Total Lymphocyte: 26.8 %

## 2022-10-17 LAB — COMPLETE METABOLIC PANEL WITH GFR
AG Ratio: 1.8 (calc) (ref 1.0–2.5)
ALT: 29 U/L (ref 9–46)
AST: 20 U/L (ref 10–40)
Albumin: 4.9 g/dL (ref 3.6–5.1)
Alkaline phosphatase (APISO): 45 U/L (ref 36–130)
BUN/Creatinine Ratio: 11 (calc) (ref 6–22)
BUN: 15 mg/dL (ref 7–25)
CO2: 24 mmol/L (ref 20–32)
Calcium: 10.2 mg/dL (ref 8.6–10.3)
Chloride: 104 mmol/L (ref 98–110)
Creat: 1.33 mg/dL — ABNORMAL HIGH (ref 0.60–1.26)
Globulin: 2.8 g/dL (calc) (ref 1.9–3.7)
Glucose, Bld: 79 mg/dL (ref 65–99)
Potassium: 4.3 mmol/L (ref 3.5–5.3)
Sodium: 140 mmol/L (ref 135–146)
Total Bilirubin: 0.4 mg/dL (ref 0.2–1.2)
Total Protein: 7.7 g/dL (ref 6.1–8.1)
eGFR: 70 mL/min/{1.73_m2} (ref >=60)

## 2022-10-17 NOTE — Progress Notes (Signed)
CBC and CMP are normal.  Creatinine is elevated at 1.33.  Patient was on diclofenac which he recently discontinued.  Please advise patient to avoid all NSAIDs and increase water intake.

## 2022-11-24 ENCOUNTER — Telehealth: Payer: Self-pay | Admitting: *Deleted

## 2022-11-24 NOTE — Telephone Encounter (Signed)
Tommy Savoy, MD  Henriette Combs, LPN According to the pharmacy note patient has been noncompliant with taking Tremfya.  Please check if patient is having any difficulty filling Tremfya or there is interruption in taking the medicine due to other reasons. Thank you, Tommy Savoy, MD

## 2022-11-24 NOTE — Telephone Encounter (Signed)
Attempted to contact the patient and left message for patient to call the office.  

## 2022-11-25 NOTE — Telephone Encounter (Signed)
Patient returned call to the office. Patient states he has taken the [facility] as prescribed. Patient states he took his last dose on 11/21/2022. Patient states he has not missed any doses. Patient states receiving the prescription went smoothly. Patient's next dose will be due on 01/16/2023. Patient has a follow up 01/22/2023 and will be due for labs at that time.

## 2022-11-25 NOTE — Telephone Encounter (Signed)
Attempted to contact the patient and left message for patient to call the office.  

## 2022-12-10 ENCOUNTER — Other Ambulatory Visit: Payer: Self-pay

## 2022-12-10 DIAGNOSIS — L409 Psoriasis, unspecified: Secondary | ICD-10-CM

## 2022-12-10 NOTE — Telephone Encounter (Signed)
Last office visit 06/13/22 Last refill 03/03/22, 118 ml, 2 refills

## 2022-12-11 MED ORDER — CLOBETASOL PROPIONATE 0.05 % EX SHAM
1.0000 | MEDICATED_SHAMPOO | Freq: Every day | CUTANEOUS | 2 refills | Status: DC
Start: 2022-12-11 — End: 2023-12-30

## 2022-12-29 ENCOUNTER — Other Ambulatory Visit: Payer: Self-pay | Admitting: Rheumatology

## 2022-12-29 DIAGNOSIS — Z79899 Other long term (current) drug therapy: Secondary | ICD-10-CM

## 2022-12-29 DIAGNOSIS — L409 Psoriasis, unspecified: Secondary | ICD-10-CM

## 2022-12-29 DIAGNOSIS — L4050 Arthropathic psoriasis, unspecified: Secondary | ICD-10-CM

## 2022-12-29 NOTE — Telephone Encounter (Signed)
Last Fill: 09/02/2022  Labs: 10/16/2022 CBC and CMP are normal.  Creatinine is elevated at 1.33.   TB Gold: 08/26/2022 Neg    Next Visit: 01/22/2023  Last Visit: 10/16/2022  DX: Psoriatic arthropathy   Current Dose per office note 10/16/2022: Tremfya 100 mg subcutaneous every 8 weeks.   Okay to refill Taltz?

## 2023-01-21 DIAGNOSIS — E291 Testicular hypofunction: Secondary | ICD-10-CM | POA: Diagnosis not present

## 2023-01-22 ENCOUNTER — Ambulatory Visit: Payer: BC Managed Care – PPO | Attending: Rheumatology | Admitting: Physician Assistant

## 2023-01-22 ENCOUNTER — Ambulatory Visit: Payer: BC Managed Care – PPO | Admitting: Rheumatology

## 2023-01-22 ENCOUNTER — Encounter: Payer: Self-pay | Admitting: Physician Assistant

## 2023-01-22 VITALS — BP 125/72 | HR 58 | Resp 17 | Ht 70.0 in | Wt 220.6 lb

## 2023-01-22 DIAGNOSIS — M79641 Pain in right hand: Secondary | ICD-10-CM | POA: Diagnosis not present

## 2023-01-22 DIAGNOSIS — M722 Plantar fascial fibromatosis: Secondary | ICD-10-CM

## 2023-01-22 DIAGNOSIS — R7989 Other specified abnormal findings of blood chemistry: Secondary | ICD-10-CM

## 2023-01-22 DIAGNOSIS — M25562 Pain in left knee: Secondary | ICD-10-CM

## 2023-01-22 DIAGNOSIS — M25561 Pain in right knee: Secondary | ICD-10-CM

## 2023-01-22 DIAGNOSIS — L4050 Arthropathic psoriasis, unspecified: Secondary | ICD-10-CM | POA: Diagnosis not present

## 2023-01-22 DIAGNOSIS — M7061 Trochanteric bursitis, right hip: Secondary | ICD-10-CM

## 2023-01-22 DIAGNOSIS — F32A Depression, unspecified: Secondary | ICD-10-CM

## 2023-01-22 DIAGNOSIS — Z79899 Other long term (current) drug therapy: Secondary | ICD-10-CM

## 2023-01-22 DIAGNOSIS — M79642 Pain in left hand: Secondary | ICD-10-CM

## 2023-01-22 DIAGNOSIS — F419 Anxiety disorder, unspecified: Secondary | ICD-10-CM

## 2023-01-22 DIAGNOSIS — M7062 Trochanteric bursitis, left hip: Secondary | ICD-10-CM

## 2023-01-22 DIAGNOSIS — M79672 Pain in left foot: Secondary | ICD-10-CM

## 2023-01-22 DIAGNOSIS — G8929 Other chronic pain: Secondary | ICD-10-CM

## 2023-01-22 DIAGNOSIS — L409 Psoriasis, unspecified: Secondary | ICD-10-CM

## 2023-01-22 DIAGNOSIS — M79671 Pain in right foot: Secondary | ICD-10-CM

## 2023-01-22 NOTE — Patient Instructions (Signed)

## 2023-01-22 NOTE — Progress Notes (Signed)
RBC count and hematocrit are borderline elevated. Rest of CBC WNL. We will continue to monitor.

## 2023-01-23 LAB — CBC WITH DIFFERENTIAL/PLATELET
Absolute Monocytes: 490 {cells}/uL (ref 200–950)
Basophils Absolute: 77 {cells}/uL (ref 0–200)
Basophils Relative: 1.1 %
Eosinophils Absolute: 329 {cells}/uL (ref 15–500)
Eosinophils Relative: 4.7 %
HCT: 52 % — ABNORMAL HIGH (ref 38.5–50.0)
Hemoglobin: 16.8 g/dL (ref 13.2–17.1)
Lymphs Abs: 2597 {cells}/uL (ref 850–3900)
MCH: 28.1 pg (ref 27.0–33.0)
MCHC: 32.3 g/dL (ref 32.0–36.0)
MCV: 87.1 fL (ref 80.0–100.0)
MPV: 10.5 fL (ref 7.5–12.5)
Monocytes Relative: 7 %
Neutro Abs: 3507 {cells}/uL (ref 1500–7800)
Neutrophils Relative %: 50.1 %
Platelets: 295 10*3/uL (ref 140–400)
RBC: 5.97 10*6/uL — ABNORMAL HIGH (ref 4.20–5.80)
RDW: 12.6 % (ref 11.0–15.0)
Total Lymphocyte: 37.1 %
WBC: 7 10*3/uL (ref 3.8–10.8)

## 2023-01-23 LAB — COMPLETE METABOLIC PANEL WITH GFR
AG Ratio: 1.8 (calc) (ref 1.0–2.5)
ALT: 24 U/L (ref 9–46)
AST: 18 U/L (ref 10–40)
Albumin: 5 g/dL (ref 3.6–5.1)
Alkaline phosphatase (APISO): 53 U/L (ref 36–130)
BUN: 12 mg/dL (ref 7–25)
CO2: 28 mmol/L (ref 20–32)
Calcium: 10.1 mg/dL (ref 8.6–10.3)
Chloride: 100 mmol/L (ref 98–110)
Creat: 1.01 mg/dL (ref 0.60–1.26)
Globulin: 2.8 g/dL (ref 1.9–3.7)
Glucose, Bld: 85 mg/dL (ref 65–99)
Potassium: 4.6 mmol/L (ref 3.5–5.3)
Sodium: 138 mmol/L (ref 135–146)
Total Bilirubin: 0.5 mg/dL (ref 0.2–1.2)
Total Protein: 7.8 g/dL (ref 6.1–8.1)
eGFR: 97 mL/min/{1.73_m2} (ref >=60)

## 2023-01-23 NOTE — Progress Notes (Signed)
CMP WNL

## 2023-02-09 DIAGNOSIS — E291 Testicular hypofunction: Secondary | ICD-10-CM | POA: Diagnosis not present

## 2023-03-22 ENCOUNTER — Other Ambulatory Visit: Payer: Self-pay | Admitting: Physician Assistant

## 2023-03-22 DIAGNOSIS — L409 Psoriasis, unspecified: Secondary | ICD-10-CM

## 2023-03-22 DIAGNOSIS — L4050 Arthropathic psoriasis, unspecified: Secondary | ICD-10-CM

## 2023-03-22 DIAGNOSIS — Z79899 Other long term (current) drug therapy: Secondary | ICD-10-CM

## 2023-03-23 NOTE — Telephone Encounter (Signed)
Last Fill: 12/29/2022  Labs: 01/22/2023 CMP WNL RBC count and hematocrit are borderline elevated. Rest of CBC WNL.   TB Gold: 08/26/2022 Neg    Next Visit: 04/28/2023  Last Visit: 01/22/2023  UE:[id number] arthropathy   Current Dose per office note 01/22/2023: Tremfya 100 mg subcutaneous every 8 weeks.   Okay to refill Tremfyia?

## 2023-03-25 ENCOUNTER — Telehealth: Payer: Self-pay | Admitting: Pharmacist

## 2023-03-25 NOTE — Telephone Encounter (Signed)
Submitted a Prior Authorization RENEWAL request to [facility] for [facility] via CoverMyMeds. Will update once we receive a response.  Key: [id number]

## 2023-03-27 NOTE — Telephone Encounter (Signed)
Received notification from [facility]  regarding a prior authorization for [facility]. Authorization has been APPROVED from 03/25/23 to 03/24/24. Approval letter sent to scan center.  Patient must continue to fill through [facility]: [phone]   Authorization # : [id number] Phone # [phone]

## 2023-04-16 NOTE — Progress Notes (Deleted)
 Office Visit Note  Patient: Tommy Cook             Date of Birth: 1985/12/25           MRN: [id number]             PCP: Arrie Senate, FNP Referring: Arrie Senate* Visit Date: 04/28/2023 Occupation: @GUAROCC @  Subjective:  No chief complaint on file.   History of Present Illness: Tommy Cook is a 37 y.o. male ***     Activities of Daily Living:  Patient reports morning stiffness for *** {minute/hour:19697}.   Patient {ACTIONS;DENIES/REPORTS:21021675::"Denies"} nocturnal pain.  Difficulty dressing/grooming: {ACTIONS;DENIES/REPORTS:21021675::"Denies"} Difficulty climbing stairs: {ACTIONS;DENIES/REPORTS:21021675::"Denies"} Difficulty getting out of chair: {ACTIONS;DENIES/REPORTS:21021675::"Denies"} Difficulty using hands for taps, buttons, cutlery, and/or writing: {ACTIONS;DENIES/REPORTS:21021675::"Denies"}  No Rheumatology ROS completed.   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   Anxiety 08/09/2021   Arthritis 08/09/2021   Always tired 08/09/2021   Deviated septum 08/09/2021   Obesity (BMI 30.0-34.9) 08/09/2021    Past Medical History:  Diagnosis Date   Anxiety    Depression    Low testosterone 08/14/2021    Family History  Problem Relation Age of Onset   Hypertension Mother    Other Mother        BRCA1 positive   Hypertension Father    Heart attack Maternal Grandfather    Heart attack Paternal Grandfather    Healthy Daughter    Healthy Son    Healthy Son    Past Surgical History:  Procedure Laterality Date   TYMPANOSTOMY TUBE PLACEMENT Bilateral    as a child   WISDOM TOOTH EXTRACTION     Social History   Social History Narrative   Not on file   Immunization History   Administered Date(s) Administered   Tdap 09/03/2001     Objective: Vital Signs: There were no vitals taken for this visit.   Physical Exam   Musculoskeletal Exam: ***  CDAI Exam: CDAI Score: -- Patient Global: --; Provider Global: -- Swollen: --; Tender: -- Joint Exam 04/28/2023   No joint exam has been documented for this visit   There is currently no information documented on the homunculus. Go to the Rheumatology activity and complete the homunculus joint exam.  Investigation: No additional findings.  Imaging: No results found.  Recent Labs: Lab Results  Component Value Date   WBC 7.0 01/22/2023   HGB 16.8 01/22/2023   PLT 295 01/22/2023   NA 138 01/22/2023   K 4.6 01/22/2023   CL 100 01/22/2023   CO2 28 01/22/2023   GLUCOSE 85 01/22/2023   BUN 12 01/22/2023   CREATININE 1.01 01/22/2023   BILITOT 0.5 01/22/2023   ALKPHOS 64 08/09/2021   AST 18 01/22/2023   ALT 24 01/22/2023   PROT 7.8 01/22/2023   ALBUMIN 5.0 08/09/2021   CALCIUM 10.1 01/22/2023   QFTBGOLDPLUS NEGATIVE 08/26/2022    Speciality Comments: Tremfya started 09/02/22  Procedures:  No procedures performed Allergies: Patient has no known allergies.   Assessment / Plan:     Visit Diagnoses: Psoriatic arthropathy (HCC)  Psoriasis  High risk medication use  Pain in both hands  Trochanteric bursitis of both hips  Chronic pain of both knees  Pain in both feet  Plantar fasciitis, bilateral  Anxiety and depression  Low testosterone  Orders: No orders of the defined types were placed in this encounter.  No orders of the defined types were placed in this encounter.   Face-to-face time spent with patient was *** minutes. Greater than 50% of time was spent in counseling and coordination of care.  Follow-Up Instructions: No follow-ups on file.   Gearldine Bienenstock, PA-C  Note - This record has been created using Dragon software.  Chart creation errors have been sought, but may not  always  have been located. Such creation errors do not reflect on  the standard of medical care.

## 2023-04-28 ENCOUNTER — Ambulatory Visit: Payer: BC Managed Care – PPO | Admitting: Physician Assistant

## 2023-04-28 DIAGNOSIS — M79641 Pain in right hand: Secondary | ICD-10-CM

## 2023-04-28 DIAGNOSIS — M722 Plantar fascial fibromatosis: Secondary | ICD-10-CM

## 2023-04-28 DIAGNOSIS — Z79899 Other long term (current) drug therapy: Secondary | ICD-10-CM

## 2023-04-28 DIAGNOSIS — M7061 Trochanteric bursitis, right hip: Secondary | ICD-10-CM

## 2023-04-28 DIAGNOSIS — R7989 Other specified abnormal findings of blood chemistry: Secondary | ICD-10-CM

## 2023-04-28 DIAGNOSIS — L4050 Arthropathic psoriasis, unspecified: Secondary | ICD-10-CM

## 2023-04-28 DIAGNOSIS — F419 Anxiety disorder, unspecified: Secondary | ICD-10-CM

## 2023-04-28 DIAGNOSIS — L409 Psoriasis, unspecified: Secondary | ICD-10-CM

## 2023-04-28 DIAGNOSIS — M79671 Pain in right foot: Secondary | ICD-10-CM

## 2023-04-28 DIAGNOSIS — G8929 Other chronic pain: Secondary | ICD-10-CM

## 2023-05-04 DIAGNOSIS — E291 Testicular hypofunction: Secondary | ICD-10-CM | POA: Diagnosis not present

## 2023-05-04 DIAGNOSIS — Z125 Encounter for screening for malignant neoplasm of prostate: Secondary | ICD-10-CM | POA: Diagnosis not present

## 2023-05-04 NOTE — Progress Notes (Unsigned)
Office Visit Note  Patient: Tommy Cook             Date of Birth: 1985-10-15           MRN: [id number]             PCP: Arrie Senate, FNP Referring: Arrie Senate* Visit Date: 05/14/2023 Occupation: @GUAROCC @  Subjective:  Medication monitoring  History of Present Illness: Tommy Cook is a 38 y.o. male with history of psoriatic arthritis.  Patient remains on Tremfya 100 mg subcutaneous every 8 weeks. Started on Sep 02, 2022.  He is tolerating Tremfya without any side effects or injection site reactions.  He denies any recent gaps in therapy.  Patient reports that he has noticed over an 80% improvement in his symptoms since initiating Tremfya.  He denies any active psoriasis at this time.  He has noticed an improvement in his nails as time has gone on.  He denies any morning stiffness, nocturnal pain, or difficulty with ADLs.  He denies any SI joint pain currently.  He denies any Achilles tendinitis or plantar fasciitis.  Denies any joint swelling.  He denies any new medical conditions.  He has not had any recent or recurrent infections.  Activities of Daily Living:  Patient reports morning stiffness for 0 minutes.   Patient Denies nocturnal pain.  Difficulty dressing/grooming: Denies Difficulty climbing stairs: Denies Difficulty getting out of chair: Denies Difficulty using hands for taps, buttons, cutlery, and/or writing: Denies  Review of Systems  Constitutional:  Positive for fatigue.  HENT:  Positive for mouth dryness. Negative for mouth sores and nose dryness.   Eyes:  Positive for dryness. Negative for pain and visual disturbance.  Respiratory:  Negative for shortness of breath and difficulty breathing.   Cardiovascular:  Negative for chest pain and palpitations.  Gastrointestinal:  Negative for blood in stool, constipation and diarrhea.  Endocrine: Positive for increased urination.  Genitourinary:  Negative for painful urination and involuntary  urination.  Musculoskeletal:  Negative for joint pain, gait problem, joint pain, joint swelling, myalgias, muscle weakness, morning stiffness, muscle tenderness and myalgias.  Skin:  Negative for color change, rash, hair loss and sensitivity to sunlight.  Allergic/Immunologic: Negative for susceptible to infections.  Neurological:  Negative for dizziness and headaches.  Hematological:  Negative for swollen glands.  Psychiatric/Behavioral:  Positive for sleep disturbance. Negative for depressed mood. The patient is not nervous/anxious.     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   Anxiety 08/09/2021   Arthritis 08/09/2021   Always tired 08/09/2021   Deviated septum 08/09/2021   Obesity (BMI 30.0-34.9) 08/09/2021    Past Medical History:  Diagnosis Date   Anxiety    Depression    Low testosterone 08/14/2021    Family History  Problem Relation Age of Onset   Hypertension Mother    Other Mother        BRCA1 positive   Hypertension Father    Heart attack Maternal Grandfather    Heart attack Paternal Grandfather    Healthy Daughter    Healthy Son    Healthy Son    Past Surgical History:  Procedure Laterality Date   TYMPANOSTOMY TUBE PLACEMENT Bilateral    as a child  WISDOM TOOTH EXTRACTION     Social History   Social History Narrative   Not on file   Immunization History  Administered Date(s) Administered   Tdap 09/03/2001     Objective: Vital Signs: BP (!) 143/86 (BP Location: Left Arm, Patient Position: Sitting, Cuff Size: Large)   Pulse 70   Resp 17   Ht 5\' 10"  (1.778 m)   Wt 216 lb (98 kg)   BMI 30.99 kg/m    Physical Exam Vitals and nursing note reviewed.  Constitutional:      Appearance: He is  well-developed.  HENT:     Head: Normocephalic and atraumatic.  Eyes:     Conjunctiva/sclera: Conjunctivae normal.     Pupils: Pupils are equal, round, and reactive to light.  Cardiovascular:     Rate and Rhythm: Normal rate and regular rhythm.     Heart sounds: Normal heart sounds.  Pulmonary:     Effort: Pulmonary effort is normal.     Breath sounds: Normal breath sounds.  Abdominal:     General: Bowel sounds are normal.     Palpations: Abdomen is soft.  Musculoskeletal:     Cervical back: Normal range of motion and neck supple.  Skin:    General: Skin is warm and dry.     Capillary Refill: Capillary refill takes less than 2 seconds.  Neurological:     Mental Status: He is alert and oriented to person, place, and time.  Psychiatric:        Behavior: Behavior normal.      Musculoskeletal Exam: C-spine, thoracic spine, lumbar spine have good range of motion.  No midline spinal tenderness.  No SI joint tenderness.  Shoulder joints, elbow joints, wrist joints, MCPs, PIPs, DIPs have good range of motion with no synovitis.  PIP and DIP thickening noted.  Hip joints have good range of motion with no groin pain.  Knee joints have good range of motion no warmth or effusion.  Ankle joints have good range of motion with no tenderness or joint swelling.  No evidence of Achilles tendinitis or plantar fasciitis.  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  CDAI Exam: CDAI Score: -- Patient Global: --; Provider Global: -- Swollen: --; Tender: -- Joint Exam 05/14/2023   No joint exam has been documented for this visit   There is currently no information documented on the homunculus. Go to the Rheumatology activity and complete the homunculus joint exam.  Investigation: No additional findings.  Imaging: No results found.  Recent Labs: Lab Results  Component Value Date   WBC 7.0 01/22/2023   HGB 16.8 01/22/2023   PLT 295 01/22/2023   NA 138 01/22/2023   K  4.6 01/22/2023   CL 100 01/22/2023   CO2 28 01/22/2023   GLUCOSE 85 01/22/2023   BUN 12 01/22/2023   CREATININE 1.01 01/22/2023   BILITOT 0.5 01/22/2023   ALKPHOS 64 08/09/2021   AST 18 01/22/2023   ALT 24 01/22/2023   PROT 7.8 01/22/2023   ALBUMIN 5.0 08/09/2021   CALCIUM 10.1 01/22/2023   QFTBGOLDPLUS NEGATIVE 08/26/2022    Speciality Comments: Tremfya started 09/02/22  Procedures:  No procedures performed Allergies: Patient has no known allergies.   Assessment / Plan:     Visit Diagnoses: Psoriatic arthropathy (HCC): He has no synovitis or dactylitis on examination today.  He has not been experiencing any morning stiffness, nocturnal pain, or difficulty with ADLs.  No evidence of Achilles tendinitis or plantar  fasciitis.  No SI joint tenderness upon palpation.  He has no active psoriasis at this time.  Fingernail pitting and dystrophy improving.  Overall the patient has noticed over an 80% improvement in his symptoms since initiating Tremfya.  Patient remains on Tremfya 100 mg subcutaneous injections every 8 weeks.  He has not had any recent gaps in therapy.  No recent or recurrent infections.  Patient will remain on Tremfya as monotherapy.  He was advised to notify us if he develops signs or symptoms of a flare.  He will follow-up in the office in 5 months or sooner if needed.  Psoriasis: No active psoriasis at this time.  Fingernail dystrophy improving.  Patient remain on Tremfya as prescribed.  High risk medication use - Tremfya 100 mg subcutaneous every 8 weeks.  Started on Sep 02, 2022. CBC and CMP updated on 01/22/23.  Orders for CBC and CMP released today.  His next lab work will be due in April and every 3 months to monitor for drug toxicity. TB gold negative 08/26/22.   No recent or recurrent infections.  Discussed the importance of holding tremfya if he develops signs or symptoms of an infection and to resume once the infection has completely cleared.   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  No synovitis or dactylitis noted on examination today.  PIP and DIP thickening noted on examination today.  Trochanteric bursitis of both hips: Not currently symptomatic.   Chronic pain of both knees: He has good ROM of both knees with no warmth or effusion.   Pain in both feet: He has noticed an improvement in the discomfort and stiffness involving both feet.  He has no active inflammation on examination today.  No evidence of Achilles tendinitis or plantar fasciitis.  Plantar fasciitis, bilateral: Not currently symptomatic.  Other medical conditions are listed as follows:  Anxiety and depression  Low testosterone  Obesity (BMI 30.0-34.9)  Orders: Orders Placed This Encounter  Procedures   CBC with Differential/Platelet   COMPLETE METABOLIC PANEL WITH GFR   No orders of the defined types were placed in this encounter.    Follow-Up Instructions: Return in about 5 months (around 10/12/2023) for Psoriatic arthritis.   Gearldine Bienenstock, PA-C  Note - This record has been created using Dragon software.  Chart creation errors have been sought, but may not always  have been located. Such creation errors do not reflect on  the standard of medical care.

## 2023-05-06 ENCOUNTER — Other Ambulatory Visit: Payer: Self-pay | Admitting: Physician Assistant

## 2023-05-06 DIAGNOSIS — L4050 Arthropathic psoriasis, unspecified: Secondary | ICD-10-CM

## 2023-05-06 DIAGNOSIS — L409 Psoriasis, unspecified: Secondary | ICD-10-CM

## 2023-05-06 DIAGNOSIS — Z79899 Other long term (current) drug therapy: Secondary | ICD-10-CM

## 2023-05-06 NOTE — Telephone Encounter (Signed)
 Last Fill: 03/23/2023  Labs: 01/22/2023 RBC count and hematocrit are borderline elevated. Rest of CBC WNL. CMP WNL   TB Gold: 08/26/2022 Neg    Next Visit: 05/14/2023  Last Visit: 01/22/2023  UJ:[id number] arthropathy   Current Dose per office note 01/22/2023: Tremfya  100 mg subcutaneous every 8 weeks.   Patient to update labs at upcoming appointment on 05/14/2023  Okay to refill Tremfyia?

## 2023-05-14 ENCOUNTER — Ambulatory Visit: Payer: BC Managed Care – PPO | Attending: Physician Assistant | Admitting: Physician Assistant

## 2023-05-14 ENCOUNTER — Encounter: Payer: Self-pay | Admitting: Physician Assistant

## 2023-05-14 VITALS — BP 143/86 | HR 70 | Resp 17 | Ht 70.0 in | Wt 216.0 lb

## 2023-05-14 DIAGNOSIS — M7062 Trochanteric bursitis, left hip: Secondary | ICD-10-CM

## 2023-05-14 DIAGNOSIS — M7061 Trochanteric bursitis, right hip: Secondary | ICD-10-CM

## 2023-05-14 DIAGNOSIS — L4050 Arthropathic psoriasis, unspecified: Secondary | ICD-10-CM | POA: Diagnosis not present

## 2023-05-14 DIAGNOSIS — E66811 Obesity, class 1: Secondary | ICD-10-CM

## 2023-05-14 DIAGNOSIS — M79642 Pain in left hand: Secondary | ICD-10-CM

## 2023-05-14 DIAGNOSIS — Z79899 Other long term (current) drug therapy: Secondary | ICD-10-CM | POA: Diagnosis not present

## 2023-05-14 DIAGNOSIS — G8929 Other chronic pain: Secondary | ICD-10-CM

## 2023-05-14 DIAGNOSIS — F419 Anxiety disorder, unspecified: Secondary | ICD-10-CM

## 2023-05-14 DIAGNOSIS — M79641 Pain in right hand: Secondary | ICD-10-CM

## 2023-05-14 DIAGNOSIS — R7989 Other specified abnormal findings of blood chemistry: Secondary | ICD-10-CM

## 2023-05-14 DIAGNOSIS — M722 Plantar fascial fibromatosis: Secondary | ICD-10-CM

## 2023-05-14 DIAGNOSIS — L409 Psoriasis, unspecified: Secondary | ICD-10-CM | POA: Diagnosis not present

## 2023-05-14 DIAGNOSIS — M79672 Pain in left foot: Secondary | ICD-10-CM

## 2023-05-14 DIAGNOSIS — M25562 Pain in left knee: Secondary | ICD-10-CM

## 2023-05-14 DIAGNOSIS — M25561 Pain in right knee: Secondary | ICD-10-CM

## 2023-05-14 DIAGNOSIS — F32A Depression, unspecified: Secondary | ICD-10-CM

## 2023-05-14 DIAGNOSIS — M79671 Pain in right foot: Secondary | ICD-10-CM

## 2023-05-14 NOTE — Patient Instructions (Signed)
Standing Labs We placed an order today for your standing lab work.   Please have your standing labs drawn in April and every 3 months   Please have your labs drawn 2 weeks prior to your appointment so that the provider can discuss your lab results at your appointment, if possible.  Please note that you may see your imaging and lab results in MyChart before we have reviewed them. We will contact you once all results are reviewed. Please allow our office up to 72 hours to thoroughly review all of the results before contacting the office for clarification of your results.  WALK-IN LAB HOURS  Monday through Thursday from 8:00 am -12:30 pm and 1:00 pm-5:00 pm and Friday from 8:00 am-12:00 pm.  Patients with office visits requiring labs will be seen before walk-in labs.  You may encounter longer than normal wait times. Please allow additional time. Wait times may be shorter on  Monday and Thursday afternoons.  We do not book appointments for walk-in labs. We appreciate your patience and understanding with our staff.   Labs are drawn by [facility]. Please bring your co-pay at the time of your lab draw.  You may receive a bill from [facility] for your lab work.  Please note if you are on Hydroxychloroquine and and an order has been placed for a Hydroxychloroquine level,  you will need to have it drawn 4 hours or more after your last dose.  If you wish to have your labs drawn at another location, please call the office 24 hours in advance so we can fax the orders.  The office is located at [address], [address], [place], [place] [address]   If you have any questions regarding directions or hours of operation,  please call [phone].   As a reminder, please drink plenty of water prior to coming for your lab work. Thanks!  

## 2023-05-15 DIAGNOSIS — E291 Testicular hypofunction: Secondary | ICD-10-CM | POA: Diagnosis not present

## 2023-05-15 DIAGNOSIS — N529 Male erectile dysfunction, unspecified: Secondary | ICD-10-CM | POA: Diagnosis not present

## 2023-05-15 LAB — COMPLETE METABOLIC PANEL WITH GFR
AG Ratio: 1.6 (calc) (ref 1.0–2.5)
ALT: 16 U/L (ref 9–46)
AST: 15 U/L (ref 10–40)
Albumin: 4.6 g/dL (ref 3.6–5.1)
Alkaline phosphatase (APISO): 37 U/L (ref 36–130)
BUN: 10 mg/dL (ref 7–25)
CO2: 25 mmol/L (ref 20–32)
Calcium: 10.2 mg/dL (ref 8.6–10.3)
Chloride: 103 mmol/L (ref 98–110)
Creat: 1.03 mg/dL (ref 0.60–1.26)
Globulin: 2.9 g/dL (ref 1.9–3.7)
Glucose, Bld: 71 mg/dL (ref 65–99)
Potassium: 4.5 mmol/L (ref 3.5–5.3)
Sodium: 139 mmol/L (ref 135–146)
Total Bilirubin: 0.5 mg/dL (ref 0.2–1.2)
Total Protein: 7.5 g/dL (ref 6.1–8.1)
eGFR: 95 mL/min/{1.73_m2} (ref >=60)

## 2023-05-15 LAB — CBC WITH DIFFERENTIAL/PLATELET
Absolute Lymphocytes: 2470 {cells}/uL (ref 850–3900)
Absolute Monocytes: 422 {cells}/uL (ref 200–950)
Basophils Absolute: 38 {cells}/uL (ref 0–200)
Basophils Relative: 0.6 %
Eosinophils Absolute: 139 {cells}/uL (ref 15–500)
Eosinophils Relative: 2.2 %
HCT: 50.4 % — ABNORMAL HIGH (ref 38.5–50.0)
Hemoglobin: 16.5 g/dL (ref 13.2–17.1)
MCH: 27.4 pg (ref 27.0–33.0)
MCHC: 32.7 g/dL (ref 32.0–36.0)
MCV: 83.6 fL (ref 80.0–100.0)
MPV: 10.6 fL (ref 7.5–12.5)
Monocytes Relative: 6.7 %
Neutro Abs: 3232 {cells}/uL (ref 1500–7800)
Neutrophils Relative %: 51.3 %
Platelets: 301 10*3/uL (ref 140–400)
RBC: 6.03 10*6/uL — ABNORMAL HIGH (ref 4.20–5.80)
RDW: 12.4 % (ref 11.0–15.0)
Total Lymphocyte: 39.2 %
WBC: 6.3 10*3/uL (ref 3.8–10.8)

## 2023-05-15 NOTE — Progress Notes (Signed)
RBC count and hematocrit remain elevated. Hematocrit has improved. Rest of CBC wnl. CMP WNL.  We will continue to monitor closely.

## 2023-06-16 ENCOUNTER — Other Ambulatory Visit: Payer: Self-pay | Admitting: Physician Assistant

## 2023-06-16 DIAGNOSIS — L4050 Arthropathic psoriasis, unspecified: Secondary | ICD-10-CM

## 2023-06-16 DIAGNOSIS — Z79899 Other long term (current) drug therapy: Secondary | ICD-10-CM

## 2023-06-16 DIAGNOSIS — L409 Psoriasis, unspecified: Secondary | ICD-10-CM

## 2023-06-16 NOTE — Telephone Encounter (Signed)
 Last Fill: 05/06/2023 (30 day supply)  Labs: 05/14/2023 RBC count and hematocrit remain elevated. Hematocrit has improved. Rest of CBC wnl. CMP WNL.   TB Gold: 08/26/2022 Neg    Next Visit: 10/13/2023  Last Visit: 05/14/2023  ZO:[id number] arthropathy   Current Dose per office note 05/14/2023: Tremfya 100 mg subcutaneous every 8 weeks.   Okay to refill Tremfyia?

## 2023-09-08 DIAGNOSIS — L739 Follicular disorder, unspecified: Secondary | ICD-10-CM | POA: Diagnosis not present

## 2023-09-08 DIAGNOSIS — L570 Actinic keratosis: Secondary | ICD-10-CM | POA: Diagnosis not present

## 2023-09-10 ENCOUNTER — Other Ambulatory Visit: Payer: Self-pay | Admitting: Rheumatology

## 2023-09-10 DIAGNOSIS — L4050 Arthropathic psoriasis, unspecified: Secondary | ICD-10-CM

## 2023-09-10 DIAGNOSIS — Z79899 Other long term (current) drug therapy: Secondary | ICD-10-CM

## 2023-09-10 DIAGNOSIS — L409 Psoriasis, unspecified: Secondary | ICD-10-CM

## 2023-09-10 DIAGNOSIS — Z111 Encounter for screening for respiratory tuberculosis: Secondary | ICD-10-CM

## 2023-09-10 NOTE — Telephone Encounter (Signed)
 Last Fill: 06/16/2023  Labs: 05/14/2023 RBC count and hematocrit remain elevated. Hematocrit has improved. Rest of CBC wnl. CMP WNL.  We will continue to monitor closely.   TB Gold: 08/26/2022 TB Gold negative   Next Visit: 10/13/2023  Last Visit: 05/14/2023  UJ:WJXBJYNWG arthropathy (HCC)   Current Dose per office note 05/14/2023: Tremfya  100 mg subcutaneous every 8 weeks.   Contacted the patient and advised he is due to update labs. Patient states he will try to come into the office tomorrow to have labs drawn. Please sign and review labs.   Okay to refill Tremfyia?

## 2023-09-17 ENCOUNTER — Other Ambulatory Visit: Payer: Self-pay | Admitting: *Deleted

## 2023-09-17 DIAGNOSIS — Z79899 Other long term (current) drug therapy: Secondary | ICD-10-CM | POA: Diagnosis not present

## 2023-09-17 DIAGNOSIS — Z111 Encounter for screening for respiratory tuberculosis: Secondary | ICD-10-CM

## 2023-09-18 ENCOUNTER — Ambulatory Visit: Payer: Self-pay | Admitting: Physician Assistant

## 2023-09-18 DIAGNOSIS — D582 Other hemoglobinopathies: Secondary | ICD-10-CM

## 2023-09-18 DIAGNOSIS — R718 Other abnormality of red blood cells: Secondary | ICD-10-CM

## 2023-09-18 NOTE — Progress Notes (Signed)
 CMP WNL.   RBC count remains elevated and continues to trend up.  Hemoglobin and hematocrit are elevated--please notify the patient and clarify if he has a hematologist? If not please place referral for further evaluation

## 2023-09-20 LAB — COMPREHENSIVE METABOLIC PANEL WITH GFR
AG Ratio: 1.8 (calc) (ref 1.0–2.5)
ALT: 12 U/L (ref 9–46)
AST: 12 U/L (ref 10–40)
Albumin: 5 g/dL (ref 3.6–5.1)
Alkaline phosphatase (APISO): 43 U/L (ref 36–130)
BUN: 11 mg/dL (ref 7–25)
CO2: 27 mmol/L (ref 20–32)
Calcium: 9.9 mg/dL (ref 8.6–10.3)
Chloride: 101 mmol/L (ref 98–110)
Creat: 1.03 mg/dL (ref 0.60–1.26)
Globulin: 2.8 g/dL (ref 1.9–3.7)
Glucose, Bld: 93 mg/dL (ref 65–99)
Potassium: 4.3 mmol/L (ref 3.5–5.3)
Sodium: 139 mmol/L (ref 135–146)
Total Bilirubin: 0.5 mg/dL (ref 0.2–1.2)
Total Protein: 7.8 g/dL (ref 6.1–8.1)
eGFR: 95 mL/min/{1.73_m2} (ref >=60)

## 2023-09-20 LAB — CBC WITH DIFFERENTIAL/PLATELET
Absolute Lymphocytes: 2115 {cells}/uL (ref 850–3900)
Absolute Monocytes: 526 {cells}/uL (ref 200–950)
Basophils Absolute: 47 {cells}/uL (ref 0–200)
Basophils Relative: 0.5 %
Eosinophils Absolute: 103 {cells}/uL (ref 15–500)
Eosinophils Relative: 1.1 %
HCT: 54.5 % — ABNORMAL HIGH (ref 38.5–50.0)
Hemoglobin: 17.6 g/dL — ABNORMAL HIGH (ref 13.2–17.1)
MCH: 27.8 pg (ref 27.0–33.0)
MCHC: 32.3 g/dL (ref 32.0–36.0)
MCV: 86.2 fL (ref 80.0–100.0)
MPV: 10.1 fL (ref 7.5–12.5)
Monocytes Relative: 5.6 %
Neutro Abs: 6608 {cells}/uL (ref 1500–7800)
Neutrophils Relative %: 70.3 %
Platelets: 320 10*3/uL (ref 140–400)
RBC: 6.32 10*6/uL — ABNORMAL HIGH (ref 4.20–5.80)
RDW: 13.4 % (ref 11.0–15.0)
Total Lymphocyte: 22.5 %
WBC: 9.4 10*3/uL (ref 3.8–10.8)

## 2023-09-20 LAB — QUANTIFERON-TB GOLD PLUS
Mitogen-NIL: 9.6 [IU]/mL
NIL: 0.01 [IU]/mL
QuantiFERON-TB Gold Plus: NEGATIVE
TB1-NIL: 0 [IU]/mL
TB2-NIL: 0 [IU]/mL

## 2023-09-21 NOTE — Progress Notes (Signed)
 TB gold negative

## 2023-09-29 NOTE — Progress Notes (Deleted)
 Office Visit Note  Patient: Tommy Cook             Date of Birth: 10/25/1985           MRN: [id number]             PCP: Cathlene Marry Lenis, FNP Referring: Cathlene Marry Lenis* Visit Date: 10/13/2023 Occupation: @GUAROCC @  Subjective:    History of Present Illness: Tommy Cook is a 38 y.o. male with history of psoriatic arthritis.  Patient remains Tremfya  100 mg subcutaneous every 8 weeks. Started on Sep 02, 2022.   CBC and CMP updated on 09/17/23. His next lab work will be due in August and every 3 months.  TB gold negative on 09/17/23.  Discussed the importance of holding tremfya  if he develops signs or symptoms of an infection and to resume once the infection has completely cleared.    Activities of Daily Living:  Patient reports morning stiffness for *** {minute/hour:19697}.   Patient {ACTIONS;DENIES/REPORTS:21021675::Denies} nocturnal pain.  Difficulty dressing/grooming: {ACTIONS;DENIES/REPORTS:21021675::Denies} Difficulty climbing stairs: {ACTIONS;DENIES/REPORTS:21021675::Denies} Difficulty getting out of chair: {ACTIONS;DENIES/REPORTS:21021675::Denies} Difficulty using hands for taps, buttons, cutlery, and/or writing: {ACTIONS;DENIES/REPORTS:21021675::Denies}  No Rheumatology ROS completed.   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   Anxiety 08/09/2021   Arthritis 08/09/2021   Always tired 08/09/2021   Deviated septum 08/09/2021   Obesity (BMI 30.0-34.9) 08/09/2021    Past Medical History:  Diagnosis Date   Anxiety    Depression    Low testosterone  08/14/2021    Family History  Problem Relation Age of Onset   Hypertension Mother    Other Mother        BRCA1 positive   Hypertension Father     Heart attack Maternal Grandfather    Heart attack Paternal Grandfather    Healthy Daughter    Healthy Son    Healthy Son    Past Surgical History:  Procedure Laterality Date   TYMPANOSTOMY TUBE PLACEMENT Bilateral    as a child   WISDOM TOOTH EXTRACTION     Social History   Social History Narrative   Not on file   Immunization History  Administered Date(s) Administered   Tdap 09/03/2001     Objective: Vital Signs: There were no vitals taken for this visit.   Physical Exam Vitals and nursing note reviewed.  Constitutional:      Appearance: He is well-developed.  HENT:     Head: Normocephalic and atraumatic.   Eyes:     Conjunctiva/sclera: Conjunctivae normal.     Pupils: Pupils are equal, round, and reactive to light.    Cardiovascular:     Rate and Rhythm: Normal rate and regular rhythm.     Heart sounds: Normal heart sounds.  Pulmonary:     Effort: Pulmonary effort is normal.     Breath sounds: Normal breath sounds.  Abdominal:     General: Bowel sounds are normal.     Palpations: Abdomen is soft.   Musculoskeletal:     Cervical back: Normal range of motion and neck supple.   Skin:    General: Skin is warm and dry.     Capillary Refill: Capillary refill takes less than 2 seconds.   Neurological:     Mental Status: He is  alert and oriented to person, place, and time.   Psychiatric:        Behavior: Behavior normal.      Musculoskeletal Exam: ***  CDAI Exam: CDAI Score: -- Patient Global: --; Provider Global: -- Swollen: --; Tender: -- Joint Exam 10/13/2023   No joint exam has been documented for this visit   There is currently no information documented on the homunculus. Go to the Rheumatology activity and complete the homunculus joint exam.  Investigation: No additional findings.  Imaging: No results found.  Recent Labs: Lab Results  Component Value Date   WBC 9.4 09/17/2023   HGB 17.6 (H) 09/17/2023   PLT 320 09/17/2023   NA  139 09/17/2023   K 4.3 09/17/2023   CL 101 09/17/2023   CO2 27 09/17/2023   GLUCOSE 93 09/17/2023   BUN 11 09/17/2023   CREATININE 1.03 09/17/2023   BILITOT 0.5 09/17/2023   ALKPHOS 64 08/09/2021   AST 12 09/17/2023   ALT 12 09/17/2023   PROT 7.8 09/17/2023   ALBUMIN 5.0 08/09/2021   CALCIUM 9.9 09/17/2023   QFTBGOLDPLUS NEGATIVE 09/17/2023    Speciality Comments: Tremfya  started 09/02/22  Procedures:  No procedures performed Allergies: Patient has no known allergies.   Assessment / Plan:     Visit Diagnoses: Psoriatic arthropathy (HCC)  Psoriasis  High risk medication use  Pain in both hands  Trochanteric bursitis of both hips  Chronic pain of both knees  Pain in both feet  Plantar fasciitis, bilateral  Anxiety and depression  Low testosterone   Orders: No orders of the defined types were placed in this encounter.  No orders of the defined types were placed in this encounter.   Face-to-face time spent with patient was *** minutes. Greater than 50% of time was spent in counseling and coordination of care.  Follow-Up Instructions: No follow-ups on file.   Waddell CHRISTELLA Craze, PA-C  Note - This record has been created using Dragon software.  Chart creation errors have been sought, but may not always  have been located. Such creation errors do not reflect on  the standard of medical care.

## 2023-10-13 ENCOUNTER — Ambulatory Visit: Payer: BC Managed Care – PPO | Admitting: Physician Assistant

## 2023-10-13 DIAGNOSIS — G8929 Other chronic pain: Secondary | ICD-10-CM

## 2023-10-13 DIAGNOSIS — L4050 Arthropathic psoriasis, unspecified: Secondary | ICD-10-CM

## 2023-10-13 DIAGNOSIS — R7989 Other specified abnormal findings of blood chemistry: Secondary | ICD-10-CM

## 2023-10-13 DIAGNOSIS — M7061 Trochanteric bursitis, right hip: Secondary | ICD-10-CM

## 2023-10-13 DIAGNOSIS — M79671 Pain in right foot: Secondary | ICD-10-CM

## 2023-10-13 DIAGNOSIS — L409 Psoriasis, unspecified: Secondary | ICD-10-CM

## 2023-10-13 DIAGNOSIS — F419 Anxiety disorder, unspecified: Secondary | ICD-10-CM

## 2023-10-13 DIAGNOSIS — M79641 Pain in right hand: Secondary | ICD-10-CM

## 2023-10-13 DIAGNOSIS — M722 Plantar fascial fibromatosis: Secondary | ICD-10-CM

## 2023-10-13 DIAGNOSIS — Z79899 Other long term (current) drug therapy: Secondary | ICD-10-CM

## 2023-11-05 ENCOUNTER — Inpatient Hospital Stay: Admitting: Hematology

## 2023-11-05 ENCOUNTER — Inpatient Hospital Stay

## 2023-11-08 ENCOUNTER — Telehealth: Payer: Self-pay | Admitting: Physician Assistant

## 2023-11-08 DIAGNOSIS — L409 Psoriasis, unspecified: Secondary | ICD-10-CM

## 2023-11-08 DIAGNOSIS — Z79899 Other long term (current) drug therapy: Secondary | ICD-10-CM

## 2023-11-08 DIAGNOSIS — L4050 Arthropathic psoriasis, unspecified: Secondary | ICD-10-CM

## 2023-11-09 ENCOUNTER — Telehealth: Payer: Self-pay | Admitting: Rheumatology

## 2023-11-09 NOTE — Telephone Encounter (Signed)
 Patient returned call to the office. Patient advised he is due for an appointment as he is on a high risk medication and he has not been seen since January 2025. Patient expressed understanding and scheduled an appointment for 11/10/2023 at 10:00 am.

## 2023-11-09 NOTE — Telephone Encounter (Signed)
See Rx refill note for details.  

## 2023-11-09 NOTE — Telephone Encounter (Signed)
 Contacted the patient to explain why he needed an appointment, he stated he would have to call the office back.

## 2023-11-09 NOTE — Telephone Encounter (Signed)
 Patient stated that he was just in for blood work and did not know the reason on why he needs a follow up appointment here. Pt would like further explanation. Please call pt back at [phone].

## 2023-11-09 NOTE — Telephone Encounter (Signed)
 Patient has not been seen since January 2025

## 2023-11-09 NOTE — Telephone Encounter (Signed)
 Please schedule patient a follow up visit. Patient due 10/12/2023 . Thanks!

## 2023-11-09 NOTE — Telephone Encounter (Signed)
 Patient contacted the office to request a medication refill.   1. Name of Medication: Tremfya   2. How are you currently taking this medication (dosage and times per day)? 1 injection every 8 weeks   3. What pharmacy would you like for that to be sent to? [facility]

## 2023-11-09 NOTE — Telephone Encounter (Signed)
 Last Fill: 09/10/2023  Labs: 09/17/2023 CMP WNL.   RBC count remains elevated and continues to trend up.  Hemoglobin and hematocrit are elevated   TB Gold: 09/17/2023 TB gold negative   Next Visit: Due around 10/12/2023 . Message sent to the front to schedule.   Last Visit: 05/14/2023  IK:Ednmpjupr arthropathy   Current Dose per office note 05/14/2023: Tremfya  100 mg subcutaneous every 8 weeks   Okay to refill Tremfyia?

## 2023-11-09 NOTE — Progress Notes (Signed)
 Office Visit Note  Patient: Tommy Cook             Date of Birth: Aug 24, 1985           MRN: [id number]             PCP: Cathlene Marry Lenis, FNP (Inactive) Referring: No ref. provider found Visit Date: 11/10/2023 Occupation: @GUAROCC @  Subjective:  Medication management  History of Present Illness: Tommy Cook is a 38 y.o. male with psoriatic arthritis and psoriasis.  He returns today after his last visit in January 2025.  He states that he has not had any flares of psoriatic arthritis or psoriasis.  He notices some dry skin on his hands.  He denies any history of dactylitis, plantar fasciitis, Achilles tendinitis or uveitis.  He denies shortness of breath.  He has been on Tremfya  100 mg subcu every 8 weeks without any interruption since the last visit.    Activities of Daily Living:  Patient reports morning stiffness for 0 minute. Patient Denies nocturnal pain.  Difficulty dressing/groo ming: Denies Difficulty climbing stairs: Denies Difficulty getting out of chair: Denies Difficulty using hands for taps, buttons, cutlery, and/or writing: Denies  Review of Systems  Constitutional:  Negative for fatigue.  HENT:  Negative for mouth sores and mouth dryness.   Eyes:  Negative for dryness.  Respiratory:  Negative for shortness of breath.   Cardiovascular:  Negative for chest pain and palpitations.  Gastrointestinal:  Negative for blood in stool, constipation and diarrhea.  Endocrine: Negative for increased urination.  Genitourinary:  Negative for involuntary urination.  Musculoskeletal:  Negative for joint pain, gait problem, joint pain, joint swelling, myalgias, muscle weakness, morning stiffness, muscle tenderness and myalgias.  Skin:  Negative for color change, rash, hair loss and sensitivity to sunlight.  Allergic/Immunologic: Negative for susceptible to infections.  Neurological:  Negative for dizziness and headaches.  Hematological:  Negative for swollen  glands.  Psychiatric/Behavioral:  Negative for depressed mood and sleep disturbance. The patient is not nervous/anxious.     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   Anxiety 08/09/2021   Arthritis 08/09/2021   Always tired 08/09/2021   Deviated septum 08/09/2021   Obesity (BMI 30.0-34.9) 08/09/2021    Past Medical History:  Diagnosis Date   Anxiety    Depression    Low testosterone  08/14/2021    Family History  Problem Relation Age of Onset   Hypertension Mother    Other Mother        BRCA1 positive   Hypertension Father    Heart attack Maternal Grandfather    Heart attack Paternal Grandfather    Healthy Daughter    Healthy Son    Healthy Son    Past Surgical History:  Procedure Laterality Date   TYMPANOSTOMY TUBE PLACEMENT Bilateral    as a child   WISDOM TOOTH EXTRACTION     Social History   Social History Narrative   Not on file   Immunization History  Administered Date(s) Administered   Tdap 09/03/2001     Objective: Vital Signs: BP (!) 145/77 (BP Location: Left Arm, Patient Position: Sitting, Cuff Size: Normal)   Pulse 69   Resp 15   Ht 5' 10 (1.778 m)  Wt 206 lb (93.4 kg)   BMI 29.56 kg/m    Physical Exam Vitals and nursing note reviewed.  Constitutional:      Appearance: He is well-developed.  HENT:     Head: Normocephalic and atraumatic.  Eyes:     Conjunctiva/sclera: Conjunctivae normal.     Pupils: Pupils are equal, round, and reactive to light.  Cardiovascular:     Rate and Rhythm: Normal rate and regular rhythm.     Heart sounds: Normal heart sounds.  Pulmonary:     Effort: Pulmonary effort is normal.     Breath sounds: Normal breath sounds.  Abdominal:     General: Bowel sounds are  normal.     Palpations: Abdomen is soft.  Musculoskeletal:     Cervical back: Normal range of motion and neck supple.  Skin:    General: Skin is warm and dry.     Capillary Refill: Capillary refill takes less than 2 seconds.  Neurological:     Mental Status: He is alert and oriented to person, place, and time.  Psychiatric:        Behavior: Behavior normal.      Musculoskeletal Exam: Cervical, thoracic and lumbar spine with good range of motion.  Shoulders, elbow joints, wrist joints, MCPs PIPs and DIPs Juengel range of motion with no synovitis.  Bilateral PIP and DIP thickening was noted.  Hip joints and knee joints in good range of motion.  There was no tenderness over ankles or MTPs.  There was no Achilles tendinitis or plantar fasciitis.  No dactylitis was noted.  CDAI Exam: CDAI Score: -- Patient Global: --; Provider Global: -- Swollen: --; Tender: -- Joint Exam 11/10/2023   No joint exam has been documented for this visit   There is currently no information documented on the homunculus. Go to the Rheumatology activity and complete the homunculus joint exam.  Investigation: No additional findings.  Imaging: No results found.  Recent Labs: Lab Results  Component Value Date   WBC 9.4 09/17/2023   HGB 17.6 (H) 09/17/2023   PLT 320 09/17/2023   NA 139 09/17/2023   K 4.3 09/17/2023   CL 101 09/17/2023   CO2 27 09/17/2023   GLUCOSE 93 09/17/2023   BUN 11 09/17/2023   CREATININE 1.03 09/17/2023   BILITOT 0.5 09/17/2023   ALKPHOS 64 08/09/2021   AST 12 09/17/2023   ALT 12 09/17/2023   PROT 7.8 09/17/2023   ALBUMIN 5.0 08/09/2021   CALCIUM 9.9 09/17/2023   QFTBGOLDPLUS NEGATIVE 09/17/2023    Speciality Comments: Tremfya  started 09/02/22  Procedures:  No procedures performed Allergies: Patient has no known allergies.   Assessment / Plan:     Visit Diagnoses: Psoriatic arthropathy (HCC)-he denies having a flare of psoriatic arthritis since the last visit.  He  denies morning stiffness, dactylitis, plantar fasciitis, Achilles tendinitis or uveitis.  There is no history of shortness of breath.  He has been on Tremfya  without any interruption since the last visit.  Psoriasis-he has some dry skin on his hands but no typical psoriasis patches.  Use of topical moisturizers were discussed.  No nail pitting was noted.  High risk medication use - Tremfya  100 mg subcutaneous every 8 weeks.  Started on Sep 02, 2022.  Sep 17, 2023 CBC and CMP were normal.  TB Gold was negative.  He was advised to get repeat labs in August and then every 3 months to monitor for drug toxicity.  Information iMessage was placed in the  AVS.  He was advised to hold Tremfya  if he develops an infection and resume after the infection resolves.  Pain in both hands-he denies discomfort in his hands.  No synovitis or dactylitis was noted.  Chronic pain of both knees-resolved.  Pain in both feet-he denies discomfort today.  No dactylitis or plantar fasciitis was noted.  Plantar fasciitis, bilateral-resolved.  Elevated blood pressure reading-blood pressure was elevated at 145/77.  Repeat blood pressure was 127/73.  He was advised to monitor blood pressure closely and follow-up with his PCP.  Anxiety and depression  Low testosterone   BMI 29.0-29.9,adult-he had intentional weight loss.  Orders: No orders of the defined types were placed in this encounter.  No orders of the defined types were placed in this encounter.    Follow-Up Instructions: Return in about 5 months (around 04/11/2024) for Psoriatic arthritis.   Maya Nash, MD  Note - This record has been created using Animal nutritionist.  Chart creation errors have been sought, but may not always  have been located. Such creation errors do not reflect on  the standard of medical care.

## 2023-11-10 ENCOUNTER — Encounter: Payer: Self-pay | Admitting: Rheumatology

## 2023-11-10 ENCOUNTER — Ambulatory Visit: Attending: Rheumatology | Admitting: Rheumatology

## 2023-11-10 VITALS — BP 127/73 | HR 69 | Resp 15 | Ht 70.0 in | Wt 206.0 lb

## 2023-11-10 DIAGNOSIS — M7061 Trochanteric bursitis, right hip: Secondary | ICD-10-CM

## 2023-11-10 DIAGNOSIS — F419 Anxiety disorder, unspecified: Secondary | ICD-10-CM

## 2023-11-10 DIAGNOSIS — L409 Psoriasis, unspecified: Secondary | ICD-10-CM | POA: Diagnosis not present

## 2023-11-10 DIAGNOSIS — M79641 Pain in right hand: Secondary | ICD-10-CM | POA: Diagnosis not present

## 2023-11-10 DIAGNOSIS — M722 Plantar fascial fibromatosis: Secondary | ICD-10-CM

## 2023-11-10 DIAGNOSIS — L4050 Arthropathic psoriasis, unspecified: Secondary | ICD-10-CM

## 2023-11-10 DIAGNOSIS — M79672 Pain in left foot: Secondary | ICD-10-CM

## 2023-11-10 DIAGNOSIS — R030 Elevated blood-pressure reading, without diagnosis of hypertension: Secondary | ICD-10-CM

## 2023-11-10 DIAGNOSIS — Z79899 Other long term (current) drug therapy: Secondary | ICD-10-CM

## 2023-11-10 DIAGNOSIS — M79642 Pain in left hand: Secondary | ICD-10-CM

## 2023-11-10 DIAGNOSIS — G8929 Other chronic pain: Secondary | ICD-10-CM

## 2023-11-10 DIAGNOSIS — R7989 Other specified abnormal findings of blood chemistry: Secondary | ICD-10-CM

## 2023-11-10 DIAGNOSIS — E66811 Obesity, class 1: Secondary | ICD-10-CM

## 2023-11-10 DIAGNOSIS — M79671 Pain in right foot: Secondary | ICD-10-CM

## 2023-11-10 DIAGNOSIS — M25561 Pain in right knee: Secondary | ICD-10-CM

## 2023-11-10 DIAGNOSIS — F32A Depression, unspecified: Secondary | ICD-10-CM

## 2023-11-10 DIAGNOSIS — M25562 Pain in left knee: Secondary | ICD-10-CM

## 2023-11-10 DIAGNOSIS — Z6829 Body mass index (BMI) 29.0-29.9, adult: Secondary | ICD-10-CM

## 2023-11-10 NOTE — Patient Instructions (Signed)
 Standing Labs We placed an order today for your standing lab work.   Please have your standing labs drawn in August and every 3 months  Please have your labs drawn 2 weeks prior to your appointment so that the provider can discuss your lab results at your appointment, if possible.  Please note that you may see your imaging and lab results in MyChart before we have reviewed them. We will contact you once all results are reviewed. Please allow our office up to 72 hours to thoroughly review all of the results before contacting the office for clarification of your results.  WALK-IN LAB HOURS  Monday through Thursday from 8:00 am -12:30 pm and 1:00 pm-4:30 pm and Friday from 8:00 am-12:00 pm.  Patients with office visits requiring labs will be seen before walk-in labs.  You may encounter longer than normal wait times. Please allow additional time. Wait times may be shorter on  Monday and Thursday afternoons.  We do not book appointments for walk-in labs. We appreciate your patience and understanding with our staff.   Labs are drawn by [facility]. Please bring your co-pay at the time of your lab draw.  You may receive a bill from [facility] for your lab work.  Please note if you are on Hydroxychloroquine and and an order has been placed for a Hydroxychloroquine level,  you will need to have it drawn 4 hours or more after your last dose.  If you wish to have your labs drawn at another location, please call the office 24 hours in advance so we can fax the orders.  The office is located at [address], [address], [place], [place] [address]   If you have any questions regarding directions or hours of operation,  please call [phone].   As a reminder, please drink plenty of water prior to coming for your lab work. Thanks!   Vaccines You are taking a medication(s) that can suppress your immune system.  The following immunizations are recommended: Flu annually Covid-19  Td/Tdap (tetanus,  diphtheria, pertussis) every 10 years Pneumonia (Prevnar 15 then Pneumovax 23 at least 1 year apart.  Alternatively, can take Prevnar 20 without needing additional dose) Shingrix: 2 doses from 4 weeks to 6 months apart  Please check with your PCP to make sure you are up to date.   If you have signs or symptoms of an infection or start antibiotics: First, call your PCP for workup of your infection. Hold your medication through the infection, until you complete your antibiotics, and until symptoms resolve if you take the following: Injectable medication (Actemra, Benlysta, Cimzia, Cosentyx, Enbrel, Humira, Kevzara, Orencia, Remicade, Simponi, Stelara, Taltz, Tremfya) Methotrexate  Leflunomide (Arava) Mycophenolate (Cellcept) Xeljanz, Olumiant, or Rinvoq

## 2023-11-12 DIAGNOSIS — E291 Testicular hypofunction: Secondary | ICD-10-CM | POA: Diagnosis not present

## 2023-12-03 IMAGING — DX DG HAND COMPLETE 3+V*R*
3 series · 3 of 3 positions shown · non-contrast
Comparison: None Available.

CLINICAL DATA: Right hand pain.

EXAM:
RIGHT HAND - COMPLETE 3+ VIEW

[hand pa]
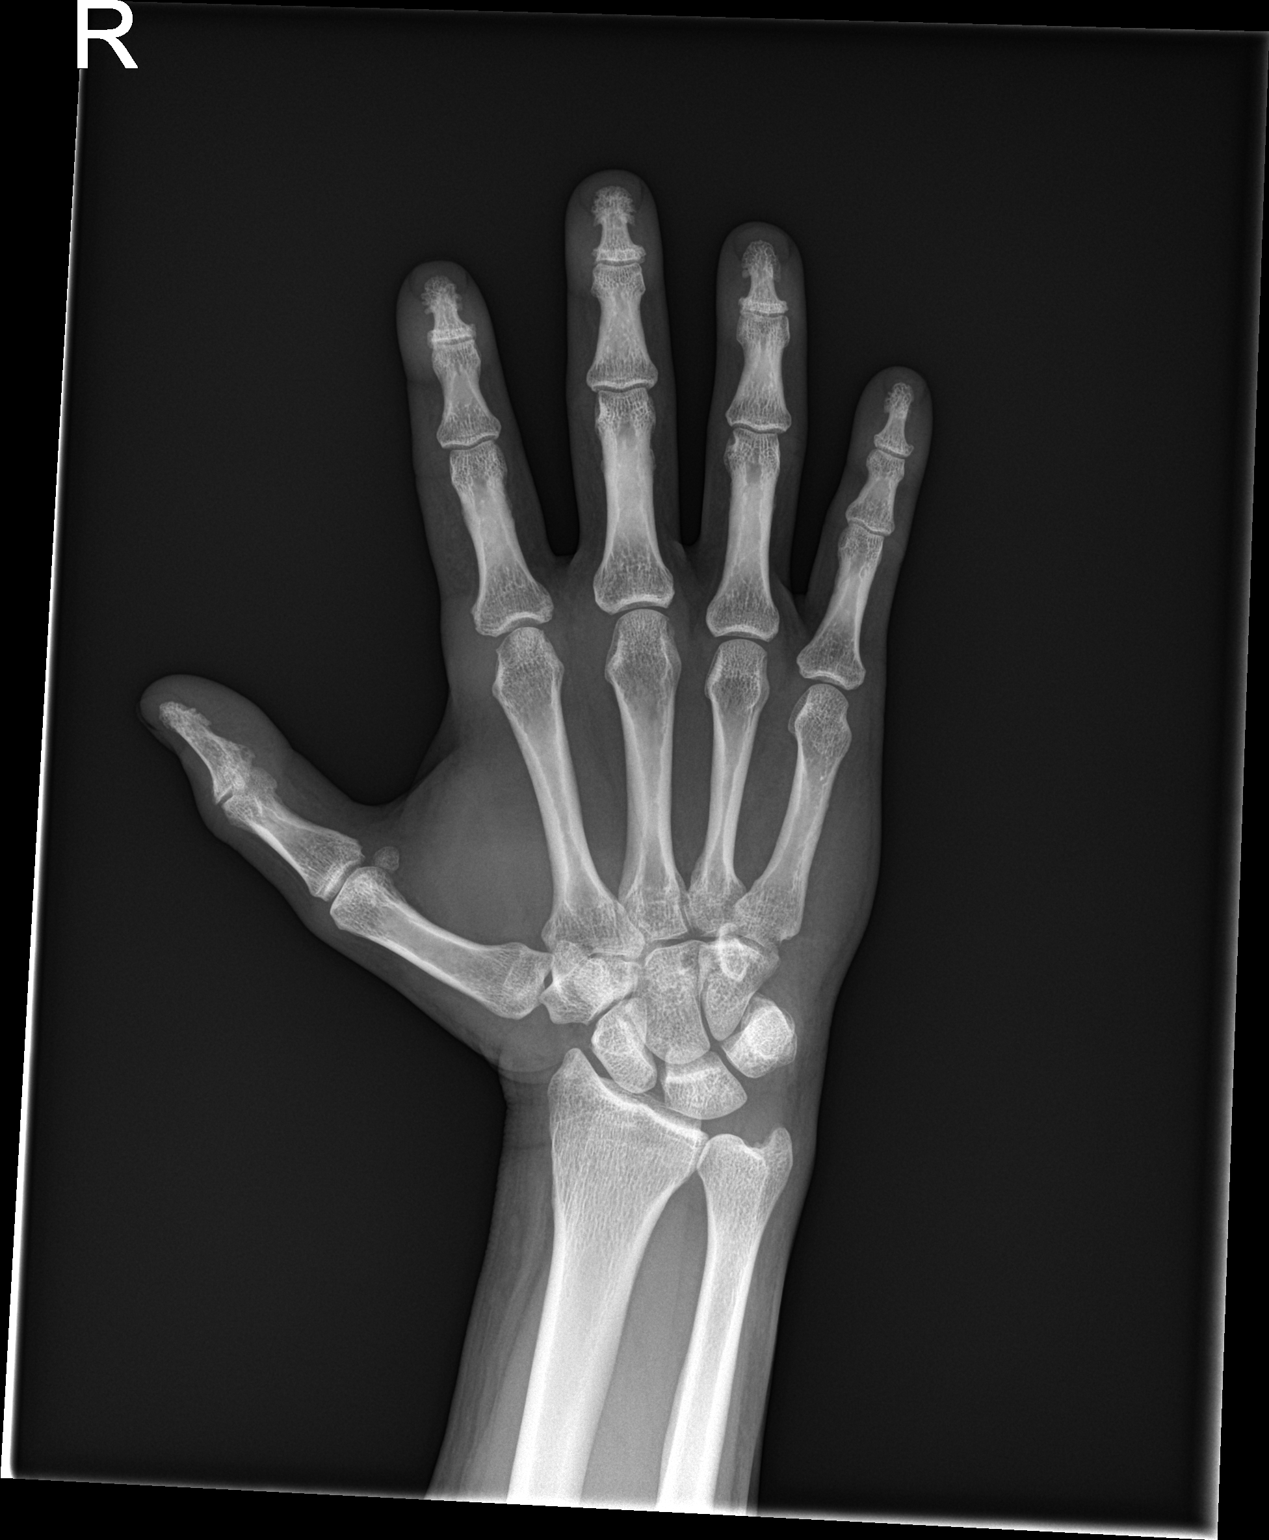

[hand obl]
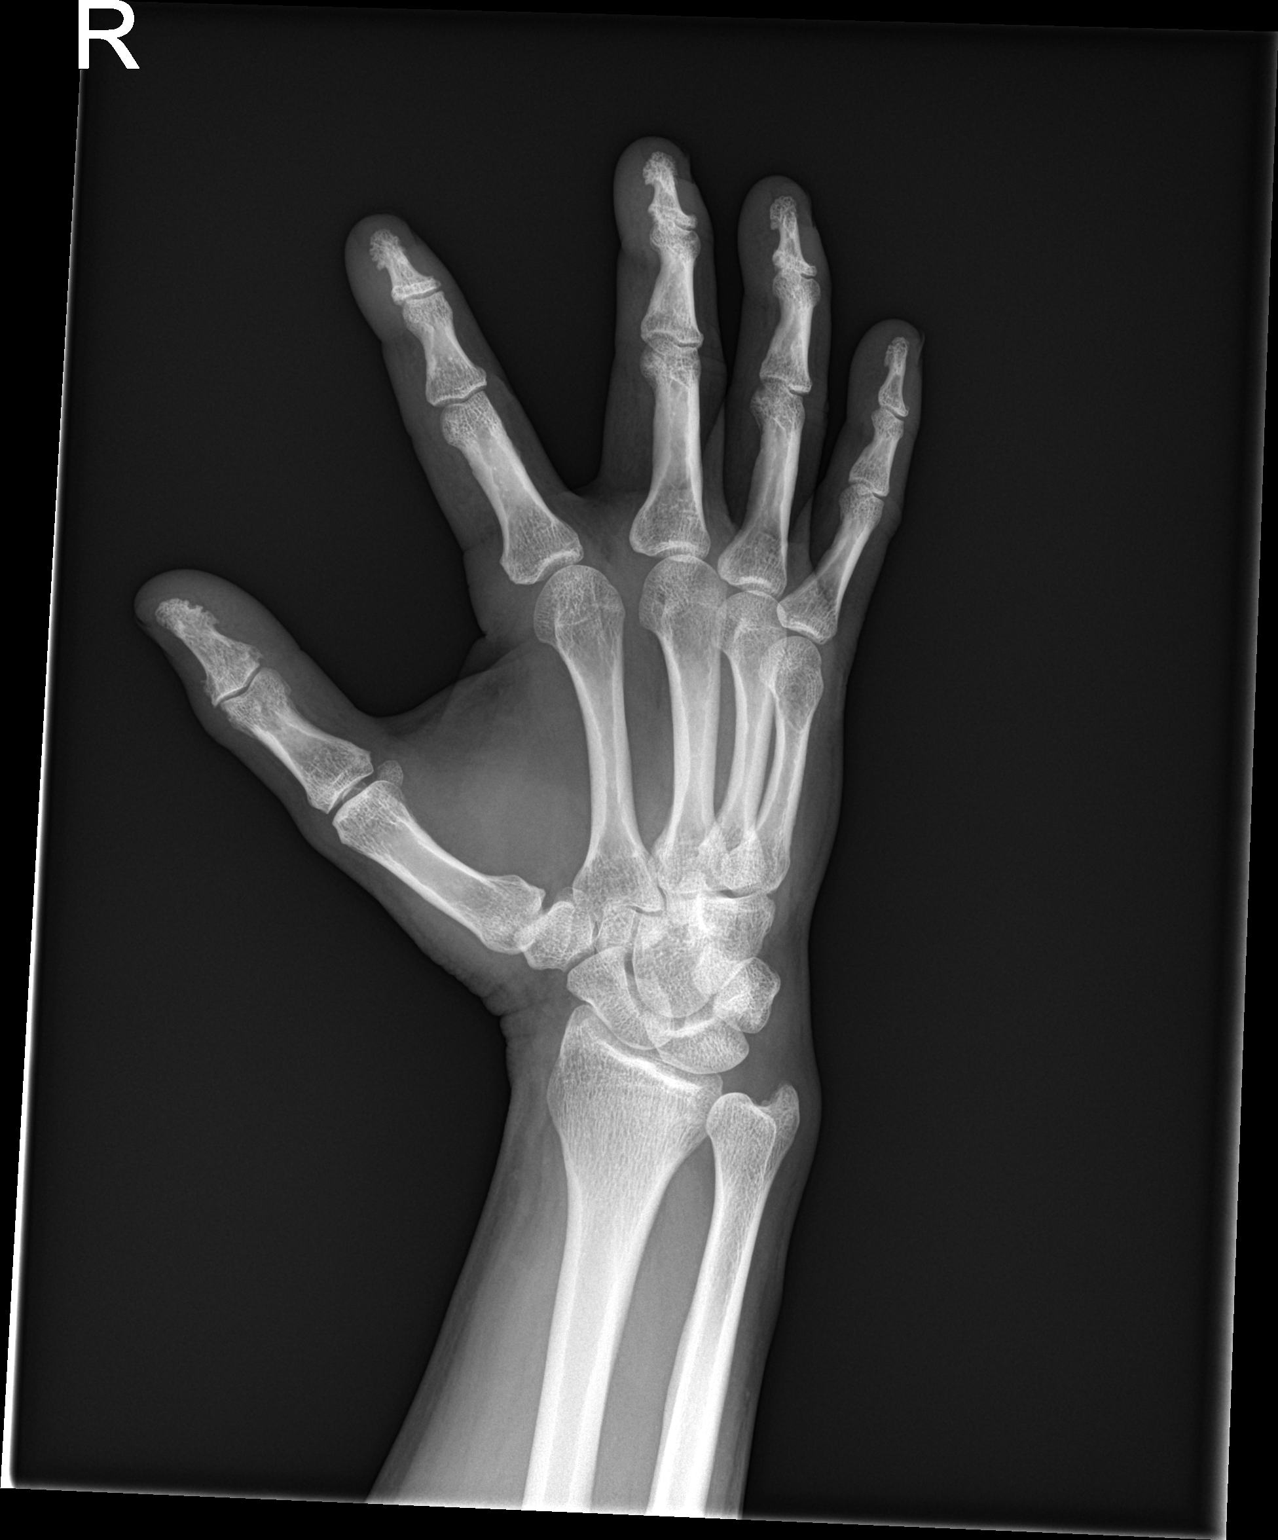

[hand lat]
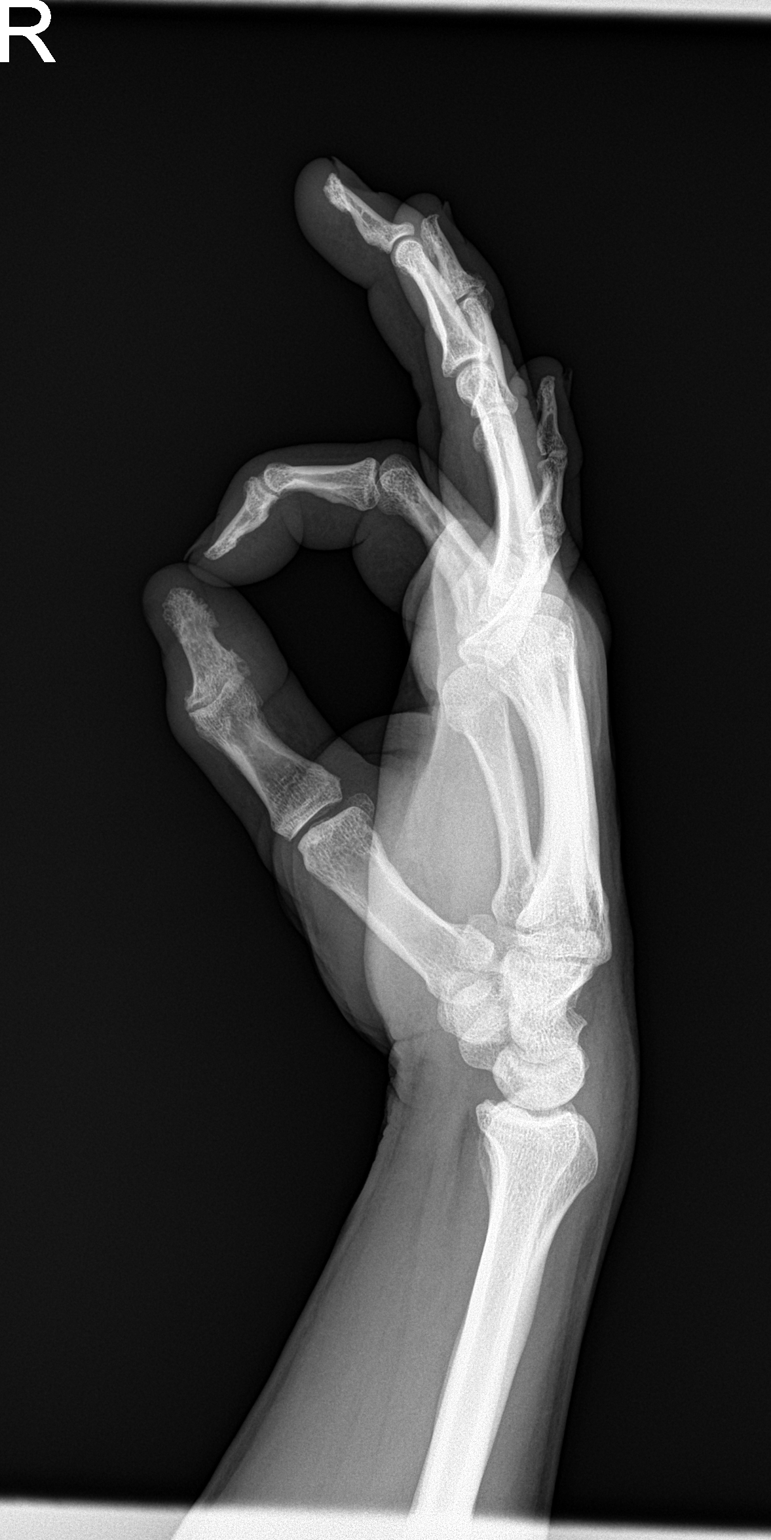

[3 of 3 positions shown; findings below may reference images not displayed]

FINDINGS: There is no evidence of fracture or dislocation. Small focal lucency
is identified in the capitate bone may be due to fibrocystic change.
Soft tissues are unremarkable.
IMPRESSION: No acute fracture or dislocation. Small focal lucency identified in
the capitate bone, may be due to fibrocystic change.

## 2023-12-04 ENCOUNTER — Ambulatory Visit: Payer: Self-pay

## 2023-12-04 DIAGNOSIS — H60311 Diffuse otitis externa, right ear: Secondary | ICD-10-CM | POA: Diagnosis not present

## 2023-12-04 NOTE — Telephone Encounter (Signed)
 FYI Only or Action Required?: Action required by provider: update on patient condition.  Patient was last seen in primary care on 06/13/2022 by Cathlene Marry Lenis, FNP.  Called Nurse Triage reporting Otalgia.  Symptoms began several days ago.  Interventions attempted: Nothing.  Symptoms are: gradually worsening.  Triage Disposition: See Physician Within 24 Hours  Patient/caregiver understands and will follow disposition?: YesCopied from CRM #[id number]. Topic: Clinical - Red Word Triage >> Dec 04, 2023  7:56 AM Tommy Cook wrote: [facility] that prompted transfer to Nurse Triage: patient states he thinks he has an ear infection in his right ear. Has pain, swelling and redness. Has gotten worse each day. Reason for Disposition  Earache  (Exceptions: Brief ear pain of lasting less than 60 minutes, or earache occurring during air travel.)  Answer Assessment - Initial Assessment Questions Inside of ear is red and puffy. No office appts. Pt stated he would go to UC.     1. LOCATION: Which ear is involved?     Right ear 2. ONSET: When did the ear pain start?      Few days ago  3. SEVERITY: How bad is the pain?  (Scale 1-10; mild, moderate or severe)    6-7 4. URI SYMPTOMS: Do you have a runny nose or cough?    Little bit of runny nose 5. FEVER: Do you have a fever? If Yes, ask: What is your temperature, how was it measured, and when did it start?     denies 6. CAUSE: Have you been swimming recently?, How often do you use Q-TIPS?, Have you had any recent air travel or scuba diving?     Swimming recently  7. OTHER SYMPTOMS: Do you have any other symptoms? (e.g., decreased hearing, dizziness, headache, stiff neck, vomiting)     Decreased hearing  Protocols used: Earache-A-AH

## 2023-12-04 NOTE — Telephone Encounter (Signed)
 Noted patient going to urgent care

## 2023-12-20 ENCOUNTER — Other Ambulatory Visit: Payer: Self-pay | Admitting: Physician Assistant

## 2023-12-20 DIAGNOSIS — L409 Psoriasis, unspecified: Secondary | ICD-10-CM

## 2023-12-20 DIAGNOSIS — L4050 Arthropathic psoriasis, unspecified: Secondary | ICD-10-CM

## 2023-12-20 DIAGNOSIS — Z79899 Other long term (current) drug therapy: Secondary | ICD-10-CM

## 2023-12-22 NOTE — Telephone Encounter (Addendum)
 Last Fill: 11/09/2023  Labs: 09/17/2023 CMP WNL.  RBC count remains elevated and continues to trend up.  Hemoglobin and hematocrit are elevated   TB Gold: 09/17/2023 Neg    Next Visit: 04/27/2024  Last Visit: 11/10/2023  DX: Psoriatic arthropathy   Current Dose per office note 11/10/2023: Tremfya  100 mg subcutaneous every 8 weeks.   Patient had labs with another physician. Patient will have them faxed to our office.   Okay to refill Tremfyia?

## 2023-12-22 NOTE — Telephone Encounter (Signed)
 Contacted patient to update labs , patient stated he had labs done at another office an will have them faxed over an was given the fax number.

## 2023-12-30 ENCOUNTER — Ambulatory Visit: Admitting: Family Medicine

## 2023-12-30 ENCOUNTER — Encounter: Payer: Self-pay | Admitting: Family Medicine

## 2023-12-30 ENCOUNTER — Ambulatory Visit: Payer: Self-pay | Admitting: *Deleted

## 2023-12-30 VITALS — BP 123/81 | HR 90 | Temp 98.6°F | Ht 70.0 in | Wt 210.2 lb

## 2023-12-30 DIAGNOSIS — H6693 Otitis media, unspecified, bilateral: Secondary | ICD-10-CM

## 2023-12-30 MED ORDER — AMOXICILLIN-POT CLAVULANATE 875-125 MG PO TABS: 1.0000 | ORAL_TABLET | Freq: Two times a day (BID) | ORAL | 0 refills | Status: AC

## 2023-12-30 NOTE — Telephone Encounter (Signed)
 Noted

## 2023-12-30 NOTE — Patient Instructions (Signed)

## 2023-12-30 NOTE — Progress Notes (Signed)
 Acute Office Visit  Subjective:     Patient ID: Tommy Cook, male    DOB: 1985-06-26, 38 y.o.   MRN: [id number]  Chief Complaint  Patient presents with   Otalgia    Otalgia     History of Present Illness   Tommy Cook is a 38 year old male who presents with ear pain.  Otalgia - Ear pain for three weeks, initially in the right ear, then the left, now back to the right - Pain is internal - No ear drainage - Hearing is muffled - No tinnitus - Ciprodex eardrops previously prescribed by UC 3 weeks ago without relief - History of childhood ear infections, not as an adult  Upper respiratory symptoms - Nasal congestion - Coughing - Sweating last night, suggesting possible fever       Review of Systems  HENT:  Positive for ear pain.    As per HPI.     Objective:    BP 123/81   Pulse 90   Temp 98.6 F (37 C) (Temporal)   Ht 5' 10 (1.778 m)   Wt 210 lb 3.2 oz (95.3 kg)   SpO2 97%   BMI 30.16 kg/m    Physical Exam Vitals and nursing note reviewed.  Constitutional:      General: He is not in acute distress.    Appearance: He is not ill-appearing, toxic-appearing or diaphoretic.  HENT:     Head: Normocephalic and atraumatic.     Right Ear: Ear canal and external ear normal. No mastoid tenderness. Tympanic membrane is erythematous and bulging.     Left Ear: Ear canal and external ear normal. No mastoid tenderness. Tympanic membrane is erythematous and bulging.     Nose: Congestion present.     Mouth/Throat:     Mouth: Mucous membranes are moist.     Pharynx: Oropharynx is clear. No oropharyngeal exudate or posterior oropharyngeal erythema.  Eyes:     General:        Right eye: No discharge.        Left eye: No discharge.     Conjunctiva/sclera: Conjunctivae normal.  Cardiovascular:     Rate and Rhythm: Normal rate and regular rhythm.     Heart sounds: Normal heart sounds. No murmur heard. Pulmonary:     Effort: Pulmonary effort is normal.      Breath sounds: Normal breath sounds.  Musculoskeletal:     Cervical back: Neck supple. No rigidity.     Right lower leg: No edema.     Left lower leg: No edema.  Lymphadenopathy:     Cervical: No cervical adenopathy.  Skin:    General: Skin is warm and dry.  Neurological:     General: No focal deficit present.     Mental Status: He is alert and oriented to person, place, and time.     No results found for any visits on 12/30/23.      Assessment & Plan:   Delos was seen today for otalgia.  Diagnoses and all orders for this visit:  Acute otitis media, bilateral -     amoxicillin -clavulanate (AUGMENTIN ) 875-125 MG tablet; Take 1 tablet by mouth 2 (two) times daily for 10 days.      Acute bilateral otitis media Acute bilateral otitis media with otalgia. - Prescribed Augmentin  875 mg orally twice daily for 10 days. - Advised return if symptoms do not improve after 2-3 days of antibiotics. - Instructed to monitor for fever and  return if symptoms persist.      Return to office for new or worsening symptoms, or if symptoms persist.   The patient indicates understanding of these issues and agrees with the plan.  Tommy CHRISTELLA Search, FNP

## 2023-12-30 NOTE — Telephone Encounter (Signed)
 FYI Only or Action Required?: FYI only for provider.  Patient was last seen in primary care on 06/13/2022 by Cathlene Marry Lenis, FNP.  Called Nurse Triage reporting Otalgia.  Symptoms began several days ago.  Interventions attempted: Prescription medications: ear drops .  Symptoms are: rapidly worsening.  Triage Disposition: See HCP Within 4 Hours (Or PCP Triage)  Patient/caregiver understands and will follow disposition?: Yes              Copied from CRM [id number]. Topic: Clinical - Red Word Triage >> Dec 30, 2023  8:54 AM Tommy Cook wrote: [facility] that prompted transfer to Nurse Triage: severe right ear pain, went urgent care was given drops and the pain has come back and not ceasing Reason for Disposition  [1] SEVERE pain (e.g., excruciating) and [2] not improved 2 hours after pain medicine (e.g., acetaminophen or ibuprofen)  Answer Assessment - Initial Assessment Questions Appt scheduled with DOD. Recommended if sx worsen go to UC/ED. Patient needs to [facility] with other provider in office.      1. LOCATION: Which ear is involved?     Right ear  2. ONSET: When did the ear pain start?      Few days  3. SEVERITY: How bad is the pain?  (Scale 1-10; mild, moderate or severe)     Pressure , severe pain 4. URI SYMPTOMS: Do you have a runny nose or cough?     Cough, runny nose  5. FEVER: Do you have a fever? If Yes, ask: What is your temperature, how was it measured, and when did it start?     Chills last night  6. CAUSE: Have you been swimming recently?, How often do you use Q-TIPS?, Have you had any recent air travel or scuba diving?     Na  7. OTHER SYMPTOMS: Do you have any other symptoms? (e.g., decreased hearing, dizziness, headache, stiff neck, vomiting)     Hearing different, pain in right ear no drainage 8. PREGNANCY: Is there any chance you are pregnant? When was your last menstrual period?     na  Protocols used: [place]

## 2024-01-01 ENCOUNTER — Ambulatory Visit: Payer: Self-pay

## 2024-01-01 NOTE — Telephone Encounter (Signed)
 Message from Roseland B sent at 01/01/2024  8:14 AM EDT  Summary: Patient can't hear at all out of his right ear   Reason for Triage: Patient has called in regards to his right ear, he had spoken triage on the 10th and had a visit and was prescribed antibiotics on the same day during a visit and now he can't hear anything out of the ear at all

## 2024-01-01 NOTE — Telephone Encounter (Signed)
 3 attempts. Unable to reach pt. Please advise.

## 2024-01-18 ENCOUNTER — Telehealth: Payer: Self-pay | Admitting: Rheumatology

## 2024-01-18 NOTE — Telephone Encounter (Signed)
 Reached out to patient to advise we received the lab results from [facility]. Advised him that they are not the labs we need and he will need to come to the office for labs prior to refilling his prescription. Reminded patient of lab hours. Patient expressed understanding.

## 2024-01-18 NOTE — Telephone Encounter (Signed)
 Patient contacted the office to request a medication refill.   1. Name of Medication: Tremfya   2. How are you currently taking this medication (dosage and times per day)? One injection every 8 weeks   3. What pharmacy would you like for that to be sent to? [facility]  Patient states [facility] will be faxing the results from his labwork he had in July.

## 2024-01-20 ENCOUNTER — Telehealth: Payer: Self-pay | Admitting: *Deleted

## 2024-01-20 NOTE — Telephone Encounter (Signed)
 Labs received from:[facility]  Drawn on: 11/12/2023  Reviewed by: Dr. Maya Nash   Labs drawn: PSA, Hct. Hgb, Testosterone    Results: Testosterone , Total 131.7

## 2024-01-22 ENCOUNTER — Encounter: Payer: Self-pay | Admitting: Urology

## 2024-02-01 ENCOUNTER — Other Ambulatory Visit: Payer: Self-pay | Admitting: Physician Assistant

## 2024-02-09 ENCOUNTER — Other Ambulatory Visit: Payer: Self-pay | Admitting: Nurse Practitioner

## 2024-02-09 ENCOUNTER — Encounter: Payer: Self-pay | Admitting: Nurse Practitioner

## 2024-02-09 ENCOUNTER — Ambulatory Visit: Admitting: Nurse Practitioner

## 2024-02-09 VITALS — BP 118/70 | HR 58 | Temp 97.4°F | Ht 70.0 in | Wt 216.0 lb

## 2024-02-09 DIAGNOSIS — H6505 Acute serous otitis media, recurrent, left ear: Secondary | ICD-10-CM

## 2024-02-09 DIAGNOSIS — H60312 Diffuse otitis externa, left ear: Secondary | ICD-10-CM | POA: Diagnosis not present

## 2024-02-09 MED ORDER — FLUTICASONE PROPIONATE 50 MCG/ACT NA SUSP
2.0000 | Freq: Every day | NASAL | 6 refills | Status: AC
Start: 2024-02-09 — End: ?

## 2024-02-09 MED ORDER — CIPRO HC 0.2-1 % OT SUSP: 3.0000 [drp] | Freq: Two times a day (BID) | OTIC | 0 refills | Status: DC

## 2024-02-09 MED ORDER — AZITHROMYCIN 250 MG PO TABS: ORAL_TABLET | ORAL | 0 refills | Status: AC

## 2024-02-09 NOTE — Patient Instructions (Addendum)
 Otitis Externa  Otitis externa is an infection of the outer ear canal. The outer ear canal is the area between the outside of the ear and the eardrum. Otitis externa is sometimes called swimmer's ear. What are the causes? Common causes of this condition include: Swimming in dirty water. Moisture in the ear. An injury to the inside of the ear. An object stuck in the ear. A cut or scrape on the outside of the ear or in the ear canal. What increases the risk? You are more likely to develop this condition if you go swimming often. What are the signs or symptoms? The first symptom of this condition is often itching in the ear. Later symptoms of the condition include: Swelling of the ear. Redness in the ear. Ear pain. The pain may get worse when you pull on your ear. Pus coming from the ear. How is this diagnosed? This condition may be diagnosed by examining the ear and testing fluid from the ear for bacteria and funguses. How is this treated? This condition may be treated with: Antibiotic ear drops. These are often given for 10-14 days. Medicines to reduce itching and swelling. Follow these instructions at home: If you were prescribed antibiotic ear drops, use them as told by your health care provider. Do not stop using the antibiotic even if you start to feel better. Take over-the-counter and prescription medicines only as told by your health care provider. Avoid getting water in your ears as told by your health care provider. This may include avoiding swimming or water sports for a few days. Keep all follow-up visits. This is important. How is this prevented? Keep your ears dry. Use the corner of a towel to dry your ears after you swim or bathe. Avoid scratching or putting things in your ear. Doing these things can damage the ear canal or remove the protective wax that lines it, which makes it easier for bacteria and funguses to grow. Avoid swimming in lakes, polluted water, or swimming  pools that may not have enough chlorine. Contact a health care provider if: You have a fever. Your ear is still red, swollen, painful, or draining pus after 3 days. Your redness, swelling, or pain gets worse. You have a severe headache. Get help right away if: You have redness, swelling, and pain or tenderness in the area behind your ear. Summary Otitis externa is an infection of the outer ear canal. Common causes include swimming in dirty water, moisture in the ear, or a cut or scrape in the ear. Symptoms include pain, redness, and swelling of the ear canal. If you were prescribed antibiotic ear drops, use them as told by your health care provider. Do not stop using the antibiotic even if you start to feel better. This information is not intended to replace advice given to you by your health care provider. Make sure you discuss any questions you have with your health care provider. Document Revised: 06/20/2020 Document Reviewed: 06/20/2020 Elsevier Patient Education  2024 [employer].

## 2024-02-09 NOTE — Progress Notes (Signed)
 Subjective:    Patient ID: Tommy Cook, male    DOB: 14-Oct-1985, 38 y.o.   MRN: [id number]  Chief Complaint: Ear Pain (Left ear. )   Patient was seen 12/30/23 and was dx with otitis media and was given augmentin . Got better but once completed antibiotic the pain started again.   Otalgia  There is pain in the left ear. The current episode started 1 to 4 weeks ago. The problem occurs constantly. The problem has been waxing and waning. There has been no fever. The pain is at a severity of 5/10. Associated symptoms include coughing and rhinorrhea. He has tried NSAIDs for the symptoms. The treatment provided mild relief.    Patient Active Problem List   Diagnosis Date Noted   Psoriatic arthropathy (HCC) 10/16/2022   Psoriasis 10/16/2022   Elevated blood pressure reading 02/05/2022   Pain in right hand 09/26/2021   Low testosterone  08/14/2021   Depressive disorder in remission 08/09/2021   Anxiety 08/09/2021   Arthritis 08/09/2021   Always tired 08/09/2021   Deviated septum 08/09/2021   Obesity (BMI 30.0-34.9) 08/09/2021       Review of Systems  Constitutional:  Negative for chills, fatigue and fever.  HENT:  Positive for ear pain and rhinorrhea.   Respiratory:  Positive for cough.        Objective:   Physical Exam Constitutional:      Appearance: Normal appearance.  HENT:     Right Ear: Tympanic membrane normal.     Left Ear: External ear normal. A middle ear effusion is present. Tympanic membrane is erythematous.     Ears:     Comments: Left tragus tenderness    Nose: Congestion present.  Cardiovascular:     Rate and Rhythm: Normal rate and regular rhythm.     Heart sounds: Normal heart sounds.  Pulmonary:     Breath sounds: Normal breath sounds.  Skin:    General: Skin is warm.  Neurological:     General: No focal deficit present.     Mental Status: He is alert and oriented to person, place, and time.  Psychiatric:        Mood and Affect: Mood normal.         Behavior: Behavior normal.     BP 118/70   Pulse (!) 58   Temp (!) 97.4 F (36.3 C) (Temporal)   Ht 5' 10 (1.778 m)   Wt 216 lb (98 kg)   SpO2 98%   BMI 30.99 kg/m        Assessment & Plan:   .Selinda LITTIE Hacker in today with chief complaint of Ear Pain (Left ear. )   1. Recurrent acute serous otitis media of left ear (Primary) Force fluids Daily flonase RTO prn - azithromycin (ZITHROMAX Z-PAK) 250 MG tablet; As directed  Dispense: 6 tablet; Refill: 0 - fluticasone (FLONASE) 50 MCG/ACT nasal spray; Place 2 sprays into both nostrils daily.  Dispense: 16 g; Refill: 6  2. Acute diffuse otitis externa of left ear Avoid getting water in ear - ciprofloxacin-hydrocortisone (CIPRO HC) OTIC suspension; Place 3 drops into the left ear 2 (two) times daily.  Dispense: 10 mL; Refill: 0    The above assessment and management plan was discussed with the patient. The patient verbalized understanding of and has agreed to the management plan. Patient is aware to call the clinic if symptoms persist or worsen. Patient is aware when to return to the clinic for a follow-up visit. Patient  educated on when it is appropriate to go to the emergency department.   Mary-Margaret Gladis, FNP

## 2024-02-09 NOTE — Telephone Encounter (Signed)
 Name from pharmacy: CIPRO HC OTIC (EAR) 0.2-1% SUS  Pharmacy comment: NDC not covered by insurance

## 2024-04-14 NOTE — Progress Notes (Deleted)
 "  Office Visit Note  Patient: Tommy Cook             Date of Birth: 08-Jul-1985           MRN: [id number]             PCP: Alcus Oneil ORN, FNP Referring: No ref. provider found Visit Date: 04/27/2024 Occupation: Data Unavailable  Subjective:    History of Present Illness: Tommy Cook is a 38 y.o. male with history of psoriatic arthritis.  Patient remains on  Tremfya  100 mg subcutaneous every 8 weeks.   CBC and CMP updated on 09/17/23. Orders for CBC and CMP released today.   TB gold negative on 09/17/23 Discussed the importance of holding tremfya  if he develops signs or symptoms of an infection and to resume once the infection has completely cleared.      Activities of Daily Living:  Patient reports morning stiffness for *** {minute/hour:19697}.   Patient {ACTIONS;DENIES/REPORTS:21021675::Denies} nocturnal pain.  Difficulty dressing/grooming: {ACTIONS;DENIES/REPORTS:21021675::Denies} Difficulty climbing stairs: {ACTIONS;DENIES/REPORTS:21021675::Denies} Difficulty getting out of chair: {ACTIONS;DENIES/REPORTS:21021675::Denies} Difficulty using hands for taps, buttons, cutlery, and/or writing: {ACTIONS;DENIES/REPORTS:21021675::Denies}  No Rheumatology ROS completed.   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   Anxiety 08/09/2021   Arthritis 08/09/2021   Always tired 08/09/2021   Deviated septum 08/09/2021   Obesity (BMI 30.0-34.9) 08/09/2021    Past Medical History:  Diagnosis Date   Anxiety    Depression    Low testosterone  08/14/2021    Family History  Problem Relation Age of Onset   Hypertension Mother    Other Mother        BRCA1 positive   Hypertension Father    Heart attack Maternal Grandfather     Heart attack Paternal Grandfather    Healthy Daughter    Healthy Son    Healthy Son    Past Surgical History:  Procedure Laterality Date   TYMPANOSTOMY TUBE PLACEMENT Bilateral    as a child   WISDOM TOOTH EXTRACTION     Social History[1] Social History   Social History Narrative   Not on file     Immunization History  Administered Date(s) Administered   Tdap 09/03/2001     Objective: Vital Signs: There were no vitals taken for this visit.   Physical Exam Vitals and nursing note reviewed.  Constitutional:      Appearance: He is well-developed.  HENT:     Head: Normocephalic and atraumatic.  Eyes:     Conjunctiva/sclera: Conjunctivae normal.     Pupils: Pupils are equal, round, and reactive to light.  Cardiovascular:     Rate and Rhythm: Normal rate and regular rhythm.     Heart sounds: Normal heart sounds.  Pulmonary:     Effort: Pulmonary effort is normal.     Breath sounds: Normal breath sounds.  Abdominal:     General: Bowel sounds are normal.     Palpations: Abdomen is soft.  Musculoskeletal:     Cervical back: Normal range of motion and neck supple.  Skin:    General: Skin is warm and dry.     Capillary Refill: Capillary refill takes less than 2 seconds.  Neurological:     Mental Status: He is alert and oriented to person, place,  and time.  Psychiatric:        Behavior: Behavior normal.      Musculoskeletal Exam: ***  CDAI Exam: CDAI Score: -- Patient Global: --; Provider Global: -- Swollen: --; Tender: -- Joint Exam 04/27/2024   No joint exam has been documented for this visit   There is currently no information documented on the homunculus. Go to the Rheumatology activity and complete the homunculus joint exam.  Investigation: No additional findings.  Imaging: No results found.  Recent Labs: Lab Results  Component Value Date   WBC 9.4 09/17/2023   HGB 17.6 (H) 09/17/2023   PLT 320 09/17/2023   NA 139 09/17/2023   K 4.3  09/17/2023   CL 101 09/17/2023   CO2 27 09/17/2023   GLUCOSE 93 09/17/2023   BUN 11 09/17/2023   CREATININE 1.03 09/17/2023   BILITOT 0.5 09/17/2023   ALKPHOS 64 08/09/2021   AST 12 09/17/2023   ALT 12 09/17/2023   PROT 7.8 09/17/2023   ALBUMIN 5.0 08/09/2021   CALCIUM 9.9 09/17/2023   QFTBGOLDPLUS NEGATIVE 09/17/2023    Speciality Comments: Tremfya  started 09/02/22  Procedures:  No procedures performed Allergies: Patient has no known allergies.   Assessment / Plan:     Visit Diagnoses: Psoriatic arthropathy (HCC)  Psoriasis  High risk medication use  Pain in both hands  Chronic pain of both knees  Pain in both feet  Plantar fasciitis, bilateral  Anxiety and depression  Low testosterone   Elevated hemoglobin  Elevated hematocrit  Trochanteric bursitis of both hips  Orders: No orders of the defined types were placed in this encounter.  No orders of the defined types were placed in this encounter.   Face-to-face time spent with patient was *** minutes. Greater than 50% of time was spent in counseling and coordination of care.  Follow-Up Instructions: No follow-ups on file.   Waddell CHRISTELLA Craze, PA-C  Note - This record has been created using Dragon software.  Chart creation errors have been sought, but may not always  have been located. Such creation errors do not reflect on  the standard of medical care.     [1]  Social History Tobacco Use   Smoking status: Former    Current packs/day: 0.00    Types: Cigarettes    Quit date: 2022    Years since quitting: 3.9    Passive exposure: Never   Smokeless tobacco: Never  Vaping Use   Vaping status: Every Day   Substances: Nicotine  Substance Use Topics   Alcohol use: Not Currently   Drug use: Not Currently   "

## 2024-04-27 ENCOUNTER — Ambulatory Visit: Admitting: Physician Assistant

## 2024-04-27 DIAGNOSIS — G8929 Other chronic pain: Secondary | ICD-10-CM

## 2024-04-27 DIAGNOSIS — Z79899 Other long term (current) drug therapy: Secondary | ICD-10-CM

## 2024-04-27 DIAGNOSIS — M722 Plantar fascial fibromatosis: Secondary | ICD-10-CM

## 2024-04-27 DIAGNOSIS — M7061 Trochanteric bursitis, right hip: Secondary | ICD-10-CM

## 2024-04-27 DIAGNOSIS — F419 Anxiety disorder, unspecified: Secondary | ICD-10-CM

## 2024-04-27 DIAGNOSIS — M79671 Pain in right foot: Secondary | ICD-10-CM

## 2024-04-27 DIAGNOSIS — L4050 Arthropathic psoriasis, unspecified: Secondary | ICD-10-CM

## 2024-04-27 DIAGNOSIS — L409 Psoriasis, unspecified: Secondary | ICD-10-CM

## 2024-04-27 DIAGNOSIS — M79641 Pain in right hand: Secondary | ICD-10-CM

## 2024-04-27 DIAGNOSIS — D582 Other hemoglobinopathies: Secondary | ICD-10-CM

## 2024-04-27 DIAGNOSIS — R7989 Other specified abnormal findings of blood chemistry: Secondary | ICD-10-CM

## 2024-04-27 DIAGNOSIS — R718 Other abnormality of red blood cells: Secondary | ICD-10-CM
